# Patient Record
Sex: Male | Born: 1970 | Race: Black or African American | Hispanic: No | Marital: Single
Health system: Southern US, Community
[De-identification: ages and names within clinical notes are randomized; demographics above are authoritative.]

## PROBLEM LIST (undated history)

## (undated) DIAGNOSIS — G8250 Quadriplegia, unspecified: Secondary | ICD-10-CM

## (undated) HISTORY — PX: COLOSTOMY: SHX63

---

## 2009-06-20 ENCOUNTER — Inpatient Hospital Stay: Payer: Self-pay | Admitting: Internal Medicine

## 2009-08-03 ENCOUNTER — Inpatient Hospital Stay: Payer: Self-pay | Admitting: Urology

## 2009-08-13 ENCOUNTER — Ambulatory Visit: Payer: Self-pay | Admitting: Family Medicine

## 2009-08-25 ENCOUNTER — Ambulatory Visit: Payer: Self-pay | Admitting: Urology

## 2009-09-04 ENCOUNTER — Ambulatory Visit: Payer: Self-pay | Admitting: Family Medicine

## 2009-09-08 ENCOUNTER — Other Ambulatory Visit: Payer: Self-pay | Admitting: Urology

## 2009-09-24 ENCOUNTER — Ambulatory Visit: Payer: Self-pay | Admitting: Family Medicine

## 2009-12-30 ENCOUNTER — Ambulatory Visit: Payer: Self-pay | Admitting: General Practice

## 2010-02-01 ENCOUNTER — Ambulatory Visit: Payer: Self-pay | Admitting: Urology

## 2010-03-24 ENCOUNTER — Ambulatory Visit: Payer: Self-pay | Admitting: Family Medicine

## 2010-08-01 ENCOUNTER — Ambulatory Visit: Payer: Self-pay | Admitting: Family Medicine

## 2010-09-21 ENCOUNTER — Ambulatory Visit: Payer: Self-pay | Admitting: Urology

## 2011-01-10 ENCOUNTER — Inpatient Hospital Stay: Payer: Self-pay | Admitting: Specialist

## 2011-06-23 ENCOUNTER — Other Ambulatory Visit: Payer: Self-pay | Admitting: Family Medicine

## 2011-06-23 LAB — CBC WITH DIFFERENTIAL/PLATELET
Basophil #: 0 10*3/uL (ref 0.0–0.1)
Eosinophil %: 0.2 %
HCT: 48.5 % (ref 40.0–52.0)
HGB: 15.9 g/dL (ref 13.0–18.0)
Lymphocyte %: 8.7 %
MCH: 32 pg (ref 26.0–34.0)
MCHC: 32.7 g/dL (ref 32.0–36.0)
MCV: 98 fL (ref 80–100)
Monocyte %: 9.2 %
Neutrophil #: 10.2 10*3/uL — ABNORMAL HIGH (ref 1.4–6.5)
RBC: 4.96 10*6/uL (ref 4.40–5.90)
RDW: 17 % — ABNORMAL HIGH (ref 11.5–14.5)
WBC: 12.6 10*3/uL — ABNORMAL HIGH (ref 3.8–10.6)

## 2011-06-23 LAB — BASIC METABOLIC PANEL
Anion Gap: 15 (ref 7–16)
BUN: 36 mg/dL — ABNORMAL HIGH (ref 7–18)
Calcium, Total: 8.9 mg/dL (ref 8.5–10.1)
Co2: 19 mmol/L — ABNORMAL LOW (ref 21–32)
Creatinine: 2.23 mg/dL — ABNORMAL HIGH (ref 0.60–1.30)
EGFR (African American): 42 — ABNORMAL LOW
EGFR (Non-African Amer.): 35 — ABNORMAL LOW
Glucose: 126 mg/dL — ABNORMAL HIGH (ref 65–99)
Osmolality: 293 (ref 275–301)
Potassium: 4.1 mmol/L (ref 3.5–5.1)
Sodium: 142 mmol/L (ref 136–145)

## 2011-11-04 ENCOUNTER — Other Ambulatory Visit: Payer: Self-pay | Admitting: Family Medicine

## 2011-11-04 LAB — URINALYSIS, COMPLETE
Nitrite: NEGATIVE
RBC,UR: 16 /HPF (ref 0–5)
Specific Gravity: 1.01 (ref 1.003–1.030)
Squamous Epithelial: NONE SEEN
WBC UR: 936 /HPF (ref 0–5)

## 2011-11-06 ENCOUNTER — Inpatient Hospital Stay: Payer: Self-pay | Admitting: Specialist

## 2011-11-06 LAB — CBC
HCT: 54.2 % — ABNORMAL HIGH (ref 40.0–52.0)
MCHC: 33.1 g/dL (ref 32.0–36.0)
Platelet: 247 10*3/uL (ref 150–440)
RBC: 5.37 10*6/uL (ref 4.40–5.90)
RDW: 15.9 % — ABNORMAL HIGH (ref 11.5–14.5)
WBC: 10.2 10*3/uL (ref 3.8–10.6)

## 2011-11-06 LAB — URINALYSIS, COMPLETE
Bilirubin,UR: NEGATIVE
Blood: NEGATIVE
Blood: NEGATIVE
Glucose,UR: NEGATIVE mg/dL (ref 0–75)
Ketone: NEGATIVE
Nitrite: NEGATIVE
Nitrite: NEGATIVE
Ph: 6 (ref 4.5–8.0)
Protein: 100
Protein: 100
RBC,UR: 7 /HPF (ref 0–5)
Specific Gravity: 1.019 (ref 1.003–1.030)
Squamous Epithelial: <1
WBC UR: 166 /HPF (ref 0–5)
WBC UR: 455 /HPF (ref 0–5)

## 2011-11-06 LAB — COMPREHENSIVE METABOLIC PANEL
Albumin: 3.5 g/dL (ref 3.4–5.0)
BUN: 26 mg/dL — ABNORMAL HIGH (ref 7–18)
Bilirubin,Total: 1 mg/dL (ref 0.2–1.0)
Chloride: 110 mmol/L — ABNORMAL HIGH (ref 98–107)
Co2: 18 mmol/L — ABNORMAL LOW (ref 21–32)
Creatinine: 1.21 mg/dL (ref 0.60–1.30)
EGFR (African American): >60
EGFR (Non-African Amer.): >60
Osmolality: 280 (ref 275–301)
SGOT(AST): 18 U/L (ref 15–37)
SGPT (ALT): 15 U/L (ref 12–78)
Sodium: 138 mmol/L (ref 136–145)
Total Protein: 10.8 g/dL — ABNORMAL HIGH (ref 6.4–8.2)

## 2011-11-06 LAB — PROTIME-INR
INR: 1.1
Prothrombin Time: 14.6 secs (ref 11.5–14.7)

## 2011-11-07 LAB — BASIC METABOLIC PANEL
Anion Gap: 7 (ref 7–16)
BUN: 25 mg/dL — ABNORMAL HIGH (ref 7–18)
Calcium, Total: 8.6 mg/dL (ref 8.5–10.1)
EGFR (African American): >60
Glucose: 80 mg/dL (ref 65–99)
Potassium: 3.4 mmol/L — ABNORMAL LOW (ref 3.5–5.1)
Sodium: 141 mmol/L (ref 136–145)

## 2011-11-07 LAB — CBC WITH DIFFERENTIAL/PLATELET
Basophil #: 0.1 10*3/uL (ref 0.0–0.1)
Eosinophil #: 0.2 10*3/uL (ref 0.0–0.7)
Eosinophil %: 2.1 %
HGB: 14.8 g/dL (ref 13.0–18.0)
Lymphocyte #: 4 10*3/uL — ABNORMAL HIGH (ref 1.0–3.6)
Lymphocyte %: 36 %
MCV: 100 fL (ref 80–100)
Monocyte %: 11.6 %
Neutrophil #: 5.5 10*3/uL (ref 1.4–6.5)
Neutrophil %: 49.8 %
Platelet: 219 10*3/uL (ref 150–440)
RBC: 4.44 10*6/uL (ref 4.40–5.90)
RDW: 16.1 % — ABNORMAL HIGH (ref 11.5–14.5)
WBC: 11.1 10*3/uL — ABNORMAL HIGH (ref 3.8–10.6)

## 2011-11-07 LAB — MAGNESIUM: Magnesium: 1.7 mg/dL — ABNORMAL LOW

## 2011-11-08 LAB — CBC WITH DIFFERENTIAL/PLATELET
Basophil #: 0.1 10*3/uL (ref 0.0–0.1)
Basophil %: 1 %
Eosinophil #: 0.2 10*3/uL (ref 0.0–0.7)
Eosinophil %: 3 %
HCT: 40.6 % (ref 40.0–52.0)
HGB: 13.4 g/dL (ref 13.0–18.0)
Lymphocyte #: 2.5 10*3/uL (ref 1.0–3.6)
MCH: 33.1 pg (ref 26.0–34.0)
MCHC: 33 g/dL (ref 32.0–36.0)
MCV: 100 fL (ref 80–100)
Monocyte %: 11 %
Neutrophil #: 3.3 10*3/uL (ref 1.4–6.5)
Neutrophil %: 48.7 %
Platelet: 200 10*3/uL (ref 150–440)
RBC: 4.05 10*6/uL — ABNORMAL LOW (ref 4.40–5.90)

## 2011-11-08 LAB — BASIC METABOLIC PANEL
Anion Gap: 8 (ref 7–16)
BUN: 17 mg/dL (ref 7–18)
Chloride: 114 mmol/L — ABNORMAL HIGH (ref 98–107)
Creatinine: 1.14 mg/dL (ref 0.60–1.30)
EGFR (African American): >60
EGFR (Non-African Amer.): >60
Glucose: 93 mg/dL (ref 65–99)
Osmolality: 286 (ref 275–301)

## 2011-11-08 LAB — POTASSIUM: Potassium: 3.5 mmol/L (ref 3.5–5.1)

## 2011-11-09 LAB — BASIC METABOLIC PANEL
Anion Gap: 7 (ref 7–16)
Calcium, Total: 8.3 mg/dL — ABNORMAL LOW (ref 8.5–10.1)
Co2: 20 mmol/L — ABNORMAL LOW (ref 21–32)
Creatinine: 1.08 mg/dL (ref 0.60–1.30)
EGFR (African American): >60
EGFR (Non-African Amer.): >60
Glucose: 118 mg/dL — ABNORMAL HIGH (ref 65–99)
Sodium: 144 mmol/L (ref 136–145)

## 2011-11-10 LAB — CBC WITH DIFFERENTIAL/PLATELET
Eosinophil #: 0.2 10*3/uL (ref 0.0–0.7)
Eosinophil %: 2.2 %
HCT: 42.8 % (ref 40.0–52.0)
HGB: 14.2 g/dL (ref 13.0–18.0)
Lymphocyte #: 2 10*3/uL (ref 1.0–3.6)
Lymphocyte %: 22.1 %
MCHC: 33.3 g/dL (ref 32.0–36.0)
MCV: 101 fL — ABNORMAL HIGH (ref 80–100)
Monocyte %: 9.7 %
Neutrophil #: 5.9 10*3/uL (ref 1.4–6.5)
Platelet: 181 10*3/uL (ref 150–440)
RDW: 15.7 % — ABNORMAL HIGH (ref 11.5–14.5)

## 2011-11-12 LAB — PATHOLOGY REPORT

## 2011-11-12 LAB — CULTURE, BLOOD (SINGLE)

## 2012-05-08 ENCOUNTER — Other Ambulatory Visit: Payer: Self-pay | Admitting: Family Medicine

## 2012-05-08 LAB — CBC WITH DIFFERENTIAL/PLATELET
Basophil #: 0 10*3/uL (ref 0.0–0.1)
Basophil %: 0.2 %
Eosinophil %: 0.8 %
HCT: 48.8 % (ref 40.0–52.0)
Lymphocyte #: 1.5 10*3/uL (ref 1.0–3.6)
Monocyte #: 0.9 x10 3/mm (ref 0.2–1.0)
Monocyte %: 4.9 %
Neutrophil #: 15.9 10*3/uL — ABNORMAL HIGH (ref 1.4–6.5)
Neutrophil %: 86 %
Platelet: 162 10*3/uL (ref 150–440)
WBC: 18.4 10*3/uL — ABNORMAL HIGH (ref 3.8–10.6)

## 2012-05-08 LAB — COMPREHENSIVE METABOLIC PANEL
Alkaline Phosphatase: 193 U/L — ABNORMAL HIGH (ref 50–136)
Anion Gap: 11 (ref 7–16)
BUN: 28 mg/dL — ABNORMAL HIGH (ref 7–18)
Bilirubin,Total: 3 mg/dL — ABNORMAL HIGH (ref 0.2–1.0)
Calcium, Total: 8.9 mg/dL (ref 8.5–10.1)
Chloride: 108 mmol/L — ABNORMAL HIGH (ref 98–107)
EGFR (African American): >60
Glucose: 123 mg/dL — ABNORMAL HIGH (ref 65–99)
Osmolality: 281 (ref 275–301)
SGOT(AST): 34 U/L (ref 15–37)
SGPT (ALT): 28 U/L (ref 12–78)
Sodium: 137 mmol/L (ref 136–145)
Total Protein: 9.2 g/dL — ABNORMAL HIGH (ref 6.4–8.2)

## 2012-05-08 LAB — URINALYSIS, COMPLETE
Glucose,UR: NEGATIVE mg/dL (ref 0–75)
Ketone: NEGATIVE
Ph: 7 (ref 4.5–8.0)
Squamous Epithelial: 17
WBC UR: 634 /HPF (ref 0–5)

## 2012-05-11 LAB — URINE CULTURE

## 2012-09-04 ENCOUNTER — Other Ambulatory Visit: Payer: Self-pay | Admitting: Family Medicine

## 2012-09-04 LAB — URINALYSIS, COMPLETE
Glucose,UR: NEGATIVE mg/dL (ref 0–75)
Ph: 6 (ref 4.5–8.0)
Protein: >=500
Specific Gravity: 1.02 (ref 1.003–1.030)
Squamous Epithelial: 5

## 2012-09-04 LAB — CBC WITH DIFFERENTIAL/PLATELET
Basophil #: 0.1 10*3/uL (ref 0.0–0.1)
Basophil %: 0.9 %
Eosinophil #: 0.2 10*3/uL (ref 0.0–0.7)
Eosinophil %: 1.5 %
HCT: 47.7 % (ref 40.0–52.0)
Lymphocyte %: 28.7 %
MCV: 100 fL (ref 80–100)
Monocyte %: 10.8 %
Neutrophil %: 58.1 %
Platelet: 359 10*3/uL (ref 150–440)
RBC: 4.76 10*6/uL (ref 4.40–5.90)
WBC: 11.1 10*3/uL — ABNORMAL HIGH (ref 3.8–10.6)

## 2012-09-04 LAB — COMPREHENSIVE METABOLIC PANEL
Albumin: 3.4 g/dL (ref 3.4–5.0)
Alkaline Phosphatase: 198 U/L — ABNORMAL HIGH (ref 50–136)
Bilirubin,Total: 0.6 mg/dL (ref 0.2–1.0)
Calcium, Total: 9.8 mg/dL (ref 8.5–10.1)
Co2: 23 mmol/L (ref 21–32)
Creatinine: 1.29 mg/dL (ref 0.60–1.30)
EGFR (African American): >60
EGFR (Non-African Amer.): >60
Glucose: 106 mg/dL — ABNORMAL HIGH (ref 65–99)
Osmolality: 271 (ref 275–301)
Sodium: 136 mmol/L (ref 136–145)

## 2012-09-12 ENCOUNTER — Other Ambulatory Visit: Payer: Self-pay

## 2012-09-12 LAB — BASIC METABOLIC PANEL
Calcium, Total: 8.4 mg/dL — ABNORMAL LOW (ref 8.5–10.1)
Co2: 23 mmol/L (ref 21–32)
EGFR (Non-African Amer.): >60
Glucose: 100 mg/dL — ABNORMAL HIGH (ref 65–99)
Osmolality: 277 (ref 275–301)
Sodium: 140 mmol/L (ref 136–145)

## 2012-09-12 LAB — CBC WITH DIFFERENTIAL/PLATELET
Basophil %: 1 %
HGB: 14.6 g/dL (ref 13.0–18.0)
Lymphocyte #: 3.9 10*3/uL — ABNORMAL HIGH (ref 1.0–3.6)
Lymphocyte %: 56.4 %
MCHC: 33.5 g/dL (ref 32.0–36.0)
Neutrophil %: 27.3 %
Platelet: 221 10*3/uL (ref 150–440)

## 2012-11-24 ENCOUNTER — Ambulatory Visit: Payer: Self-pay | Admitting: Internal Medicine

## 2012-12-05 ENCOUNTER — Inpatient Hospital Stay: Payer: Self-pay | Admitting: Internal Medicine

## 2012-12-05 LAB — URINALYSIS, COMPLETE
Bilirubin,UR: NEGATIVE
Glucose,UR: NEGATIVE mg/dL (ref 0–75)
Ph: 5 (ref 4.5–8.0)
Protein: >=500
Squamous Epithelial: 39
WBC UR: 3958 /HPF (ref 0–5)

## 2012-12-05 LAB — COMPREHENSIVE METABOLIC PANEL
Albumin: 3.4 g/dL (ref 3.4–5.0)
Alkaline Phosphatase: 201 U/L — ABNORMAL HIGH (ref 50–136)
Anion Gap: 10 (ref 7–16)
BUN: 15 mg/dL (ref 7–18)
Bilirubin,Total: 0.8 mg/dL (ref 0.2–1.0)
Creatinine: 1.57 mg/dL — ABNORMAL HIGH (ref 0.60–1.30)
EGFR (African American): >60
Osmolality: 284 (ref 275–301)
Total Protein: 10.3 g/dL — ABNORMAL HIGH (ref 6.4–8.2)

## 2012-12-05 LAB — CBC
MCH: 34.1 pg — ABNORMAL HIGH (ref 26.0–34.0)
MCHC: 33.2 g/dL (ref 32.0–36.0)
Platelet: 211 10*3/uL (ref 150–440)
RBC: 5.95 10*6/uL — ABNORMAL HIGH (ref 4.40–5.90)
RDW: 17.4 % — ABNORMAL HIGH (ref 11.5–14.5)
WBC: 22.7 10*3/uL — ABNORMAL HIGH (ref 3.8–10.6)

## 2012-12-05 LAB — HEMOGLOBIN A1C: Hemoglobin A1C: 6.2 % (ref 4.2–6.3)

## 2012-12-06 LAB — BASIC METABOLIC PANEL
Anion Gap: 6 — ABNORMAL LOW (ref 7–16)
Anion Gap: 8 (ref 7–16)
BUN: 54 mg/dL — ABNORMAL HIGH (ref 7–18)
Calcium, Total: 7.4 mg/dL — ABNORMAL LOW (ref 8.5–10.1)
Calcium, Total: 6.9 mg/dL — CL (ref 8.5–10.1)
Calcium, Total: 7.6 mg/dL — ABNORMAL LOW (ref 8.5–10.1)
Chloride: 112 mmol/L — ABNORMAL HIGH (ref 98–107)
Chloride: 115 mmol/L — ABNORMAL HIGH (ref 98–107)
Chloride: 115 mmol/L — ABNORMAL HIGH (ref 98–107)
Co2: 16 mmol/L — ABNORMAL LOW (ref 21–32)
Co2: 19 mmol/L — ABNORMAL LOW (ref 21–32)
Co2: 17 mmol/L — ABNORMAL LOW (ref 21–32)
Creatinine: 3.37 mg/dL — ABNORMAL HIGH (ref 0.60–1.30)
EGFR (African American): 25 — ABNORMAL LOW
EGFR (African American): 22 — ABNORMAL LOW
EGFR (African American): 21 — ABNORMAL LOW
EGFR (Non-African Amer.): 21 — ABNORMAL LOW
EGFR (Non-African Amer.): 19 — ABNORMAL LOW
EGFR (Non-African Amer.): 18 — ABNORMAL LOW
Glucose: 244 mg/dL — ABNORMAL HIGH (ref 65–99)
Glucose: 367 mg/dL — ABNORMAL HIGH (ref 65–99)
Glucose: 176 mg/dL — ABNORMAL HIGH (ref 65–99)
Osmolality: 309 (ref 275–301)
Osmolality: 301 (ref 275–301)
Potassium: 7.9 mmol/L (ref 3.5–5.1)
Sodium: 140 mmol/L (ref 136–145)
Sodium: 140 mmol/L (ref 136–145)

## 2012-12-06 LAB — CBC WITH DIFFERENTIAL/PLATELET
HCT: 42.3 % (ref 40.0–52.0)
HGB: 14 g/dL (ref 13.0–18.0)
MCHC: 33 g/dL (ref 32.0–36.0)
Monocytes: 13 %
NRBC/100 WBC: 1 /
Platelet: 87 10*3/uL — ABNORMAL LOW (ref 150–440)
RDW: 17.4 % — ABNORMAL HIGH (ref 11.5–14.5)

## 2012-12-06 LAB — LIPASE, BLOOD: Lipase: 8526 U/L — ABNORMAL HIGH (ref 73–393)

## 2012-12-07 LAB — CBC WITH DIFFERENTIAL/PLATELET
Basophil #: 0.1 10*3/uL (ref 0.0–0.1)
Eosinophil %: 0.3 %
HCT: 40.6 % (ref 40.0–52.0)
HGB: 13.5 g/dL (ref 13.0–18.0)
Lymphocyte #: 3.1 10*3/uL (ref 1.0–3.6)
Lymphocyte %: 16.9 %
Monocyte #: 1.9 x10 3/mm — ABNORMAL HIGH (ref 0.2–1.0)
Neutrophil #: 13.4 10*3/uL — ABNORMAL HIGH (ref 1.4–6.5)
Neutrophil %: 72.3 %
Platelet: 76 10*3/uL — ABNORMAL LOW (ref 150–440)
RDW: 17 % — ABNORMAL HIGH (ref 11.5–14.5)
WBC: 18.5 10*3/uL — ABNORMAL HIGH (ref 3.8–10.6)

## 2012-12-07 LAB — BASIC METABOLIC PANEL
Anion Gap: 9 (ref 7–16)
BUN: 59 mg/dL — ABNORMAL HIGH (ref 7–18)
Chloride: 116 mmol/L — ABNORMAL HIGH (ref 98–107)
Creatinine: 4.23 mg/dL — ABNORMAL HIGH (ref 0.60–1.30)
EGFR (African American): 19 — ABNORMAL LOW
EGFR (Non-African Amer.): 16 — ABNORMAL LOW
Osmolality: 302 (ref 275–301)

## 2012-12-07 LAB — LIPASE, BLOOD: Lipase: 3049 U/L — ABNORMAL HIGH (ref 73–393)

## 2012-12-07 LAB — URINE CULTURE

## 2012-12-07 LAB — MAGNESIUM: Magnesium: 1.4 mg/dL — ABNORMAL LOW

## 2012-12-08 LAB — CBC WITH DIFFERENTIAL/PLATELET
Basophil #: 0 10*3/uL (ref 0.0–0.1)
Basophil %: 0.2 %
Eosinophil %: 1 %
HGB: 12.2 g/dL — ABNORMAL LOW (ref 13.0–18.0)
Lymphocyte #: 2.9 10*3/uL (ref 1.0–3.6)
Lymphocyte %: 15.1 %
MCHC: 34.1 g/dL (ref 32.0–36.0)
MCV: 102 fL — ABNORMAL HIGH (ref 80–100)
Neutrophil #: 15 10*3/uL — ABNORMAL HIGH (ref 1.4–6.5)
Neutrophil %: 77.7 %
Platelet: 62 10*3/uL — ABNORMAL LOW (ref 150–440)

## 2012-12-08 LAB — LIPASE, BLOOD: Lipase: 926 U/L — ABNORMAL HIGH (ref 73–393)

## 2012-12-08 LAB — BASIC METABOLIC PANEL
BUN: 59 mg/dL — ABNORMAL HIGH (ref 7–18)
Calcium, Total: 7.1 mg/dL — ABNORMAL LOW (ref 8.5–10.1)
Chloride: 112 mmol/L — ABNORMAL HIGH (ref 98–107)
Creatinine: 4.66 mg/dL — ABNORMAL HIGH (ref 0.60–1.30)
EGFR (African American): 17 — ABNORMAL LOW
Glucose: 225 mg/dL — ABNORMAL HIGH (ref 65–99)
Potassium: 4.1 mmol/L (ref 3.5–5.1)

## 2012-12-08 LAB — PHOSPHORUS: Phosphorus: 3.1 mg/dL (ref 2.5–4.9)

## 2012-12-09 LAB — BASIC METABOLIC PANEL
Anion Gap: 9 (ref 7–16)
BUN: 54 mg/dL — ABNORMAL HIGH (ref 7–18)
Calcium, Total: 6.7 mg/dL — CL (ref 8.5–10.1)
Co2: 27 mmol/L (ref 21–32)
EGFR (African American): 19 — ABNORMAL LOW
Glucose: 180 mg/dL — ABNORMAL HIGH (ref 65–99)
Potassium: 3 mmol/L — ABNORMAL LOW (ref 3.5–5.1)

## 2012-12-09 LAB — CBC WITH DIFFERENTIAL/PLATELET
Basophil %: 0.4 %
Eosinophil #: 0.3 10*3/uL (ref 0.0–0.7)
Eosinophil %: 1.4 %
HCT: 33.3 % — ABNORMAL LOW (ref 40.0–52.0)
Lymphocyte %: 14 %
Monocyte #: 0.9 x10 3/mm (ref 0.2–1.0)
Monocyte %: 4.5 %
Neutrophil #: 15.4 10*3/uL — ABNORMAL HIGH (ref 1.4–6.5)
Neutrophil %: 79.7 %

## 2012-12-09 LAB — TRIGLYCERIDES: Triglycerides: 305 mg/dL — ABNORMAL HIGH (ref 0–200)

## 2012-12-09 LAB — MAGNESIUM: Magnesium: 2.3 mg/dL

## 2012-12-09 LAB — PHOSPHORUS: Phosphorus: 2.1 mg/dL — ABNORMAL LOW (ref 2.5–4.9)

## 2012-12-10 LAB — CBC WITH DIFFERENTIAL/PLATELET
Eosinophil #: 0.4 10*3/uL (ref 0.0–0.7)
Eosinophil %: 1.9 %
Lymphocyte #: 3.2 10*3/uL (ref 1.0–3.6)
Lymphocyte %: 17.1 %
MCH: 34.5 pg — ABNORMAL HIGH (ref 26.0–34.0)
MCV: 101 fL — ABNORMAL HIGH (ref 80–100)
Monocyte #: 1.4 x10 3/mm — ABNORMAL HIGH (ref 0.2–1.0)
Monocyte %: 7.1 %
Neutrophil %: 73.6 %
RDW: 16.7 % — ABNORMAL HIGH (ref 11.5–14.5)
WBC: 19 10*3/uL — ABNORMAL HIGH (ref 3.8–10.6)

## 2012-12-10 LAB — BASIC METABOLIC PANEL
BUN: 47 mg/dL — ABNORMAL HIGH (ref 7–18)
Calcium, Total: 7.1 mg/dL — ABNORMAL LOW (ref 8.5–10.1)
Chloride: 108 mmol/L — ABNORMAL HIGH (ref 98–107)
Creatinine: 4.22 mg/dL — ABNORMAL HIGH (ref 0.60–1.30)
EGFR (Non-African Amer.): 16 — ABNORMAL LOW
Glucose: 172 mg/dL — ABNORMAL HIGH (ref 65–99)
Osmolality: 301 (ref 275–301)
Sodium: 143 mmol/L (ref 136–145)

## 2012-12-10 LAB — MAGNESIUM: Magnesium: 2 mg/dL

## 2012-12-10 LAB — PHOSPHORUS: Phosphorus: 3 mg/dL (ref 2.5–4.9)

## 2012-12-10 LAB — POTASSIUM: Potassium: 4.2 mmol/L (ref 3.5–5.1)

## 2012-12-10 LAB — VANCOMYCIN, TROUGH: Vancomycin, Trough: 25 ug/mL (ref 10–20)

## 2012-12-11 LAB — CBC WITH DIFFERENTIAL/PLATELET
Eosinophil #: 0.3 10*3/uL (ref 0.0–0.7)
HCT: 33.3 % — ABNORMAL LOW (ref 40.0–52.0)
HGB: 11.3 g/dL — ABNORMAL LOW (ref 13.0–18.0)
Lymphocyte #: 3.4 10*3/uL (ref 1.0–3.6)
Lymphocyte %: 15.4 %
MCHC: 34 g/dL (ref 32.0–36.0)
Monocyte %: 8.9 %
Neutrophil #: 16.6 10*3/uL — ABNORMAL HIGH (ref 1.4–6.5)
Neutrophil %: 74.3 %
Platelet: 133 10*3/uL — ABNORMAL LOW (ref 150–440)
RBC: 3.29 10*6/uL — ABNORMAL LOW (ref 4.40–5.90)
RDW: 16.4 % — ABNORMAL HIGH (ref 11.5–14.5)
WBC: 22.3 10*3/uL — ABNORMAL HIGH (ref 3.8–10.6)

## 2012-12-11 LAB — BASIC METABOLIC PANEL
BUN: 47 mg/dL — ABNORMAL HIGH (ref 7–18)
Chloride: 109 mmol/L — ABNORMAL HIGH (ref 98–107)
Co2: 24 mmol/L (ref 21–32)
Creatinine: 4.28 mg/dL — ABNORMAL HIGH (ref 0.60–1.30)
EGFR (African American): 19 — ABNORMAL LOW
Potassium: 3.5 mmol/L (ref 3.5–5.1)

## 2012-12-11 LAB — MAGNESIUM: Magnesium: 2.1 mg/dL

## 2012-12-12 LAB — BASIC METABOLIC PANEL
BUN: 42 mg/dL — ABNORMAL HIGH (ref 7–18)
Calcium, Total: 7.6 mg/dL — ABNORMAL LOW (ref 8.5–10.1)
Creatinine: 3.94 mg/dL — ABNORMAL HIGH (ref 0.60–1.30)
EGFR (Non-African Amer.): 18 — ABNORMAL LOW
Potassium: 3 mmol/L — ABNORMAL LOW (ref 3.5–5.1)
Sodium: 142 mmol/L (ref 136–145)

## 2012-12-12 LAB — CBC WITH DIFFERENTIAL/PLATELET
Basophil #: 0.1 10*3/uL (ref 0.0–0.1)
Basophil %: 0.7 %
Eosinophil #: 0.4 10*3/uL (ref 0.0–0.7)
HCT: 32.2 % — ABNORMAL LOW (ref 40.0–52.0)
Lymphocyte #: 3.6 10*3/uL (ref 1.0–3.6)
MCH: 34.7 pg — ABNORMAL HIGH (ref 26.0–34.0)
MCHC: 34.5 g/dL (ref 32.0–36.0)
MCV: 101 fL — ABNORMAL HIGH (ref 80–100)
Monocyte #: 1.9 x10 3/mm — ABNORMAL HIGH (ref 0.2–1.0)
Monocyte %: 8.8 %
Neutrophil %: 71.6 %
RDW: 16 % — ABNORMAL HIGH (ref 11.5–14.5)

## 2012-12-12 LAB — PHOSPHORUS: Phosphorus: 4.5 mg/dL (ref 2.5–4.9)

## 2012-12-12 LAB — MAGNESIUM: Magnesium: 1.8 mg/dL

## 2012-12-12 LAB — POTASSIUM: Potassium: 3.2 mmol/L — ABNORMAL LOW (ref 3.5–5.1)

## 2012-12-12 LAB — CULTURE, BLOOD (SINGLE)

## 2012-12-12 LAB — VANCOMYCIN, TROUGH: Vancomycin, Trough: 30 ug/mL (ref 10–20)

## 2012-12-13 LAB — CBC WITH DIFFERENTIAL/PLATELET
Basophil #: 0.1 10*3/uL (ref 0.0–0.1)
Eosinophil #: 0.5 10*3/uL (ref 0.0–0.7)
Eosinophil %: 1.9 %
HCT: 34.2 % — ABNORMAL LOW (ref 40.0–52.0)
HGB: 11.5 g/dL — ABNORMAL LOW (ref 13.0–18.0)
MCH: 33.9 pg (ref 26.0–34.0)
MCV: 101 fL — ABNORMAL HIGH (ref 80–100)
Neutrophil #: 21.6 10*3/uL — ABNORMAL HIGH (ref 1.4–6.5)
RBC: 3.39 10*6/uL — ABNORMAL LOW (ref 4.40–5.90)
WBC: 27.2 10*3/uL — ABNORMAL HIGH (ref 3.8–10.6)

## 2012-12-13 LAB — BASIC METABOLIC PANEL
Anion Gap: 10 (ref 7–16)
Calcium, Total: 7.9 mg/dL — ABNORMAL LOW (ref 8.5–10.1)
Co2: 22 mmol/L (ref 21–32)
Creatinine: 3.57 mg/dL — ABNORMAL HIGH (ref 0.60–1.30)
EGFR (Non-African Amer.): 20 — ABNORMAL LOW
Osmolality: 297 (ref 275–301)

## 2012-12-13 LAB — PHOSPHORUS: Phosphorus: 3.5 mg/dL (ref 2.5–4.9)

## 2012-12-14 LAB — BRONCHIAL WASH CULTURE

## 2012-12-14 LAB — MAGNESIUM: Magnesium: 1.9 mg/dL

## 2012-12-14 LAB — SODIUM: Sodium: 143 mmol/L (ref 136–145)

## 2012-12-14 LAB — POTASSIUM: Potassium: 3.3 mmol/L — ABNORMAL LOW (ref 3.5–5.1)

## 2012-12-14 LAB — ALBUMIN: Albumin: 1.5 g/dL — ABNORMAL LOW (ref 3.4–5.0)

## 2012-12-14 LAB — CULTURE, BLOOD (SINGLE)

## 2012-12-15 LAB — BASIC METABOLIC PANEL
BUN: 30 mg/dL — ABNORMAL HIGH (ref 7–18)
Calcium, Total: 8.2 mg/dL — ABNORMAL LOW (ref 8.5–10.1)
Chloride: 111 mmol/L — ABNORMAL HIGH (ref 98–107)
Co2: 22 mmol/L (ref 21–32)
EGFR (African American): 32 — ABNORMAL LOW
EGFR (Non-African Amer.): 27 — ABNORMAL LOW
Glucose: 211 mg/dL — ABNORMAL HIGH (ref 65–99)
Osmolality: 297 (ref 275–301)
Potassium: 3.9 mmol/L (ref 3.5–5.1)
Sodium: 143 mmol/L (ref 136–145)

## 2012-12-15 LAB — CBC WITH DIFFERENTIAL/PLATELET
Eosinophil %: 2.2 %
HCT: 31.8 % — ABNORMAL LOW (ref 40.0–52.0)
HGB: 10.9 g/dL — ABNORMAL LOW (ref 13.0–18.0)
Lymphocyte %: 14.7 %
MCH: 34.8 pg — ABNORMAL HIGH (ref 26.0–34.0)
MCHC: 34.4 g/dL (ref 32.0–36.0)
MCV: 101 fL — ABNORMAL HIGH (ref 80–100)
Monocyte %: 6.7 %
Neutrophil %: 75.9 %
Platelet: 361 10*3/uL (ref 150–440)
RBC: 3.14 10*6/uL — ABNORMAL LOW (ref 4.40–5.90)
RDW: 16.3 % — ABNORMAL HIGH (ref 11.5–14.5)
WBC: 25.6 10*3/uL — ABNORMAL HIGH (ref 3.8–10.6)

## 2012-12-15 LAB — PHOSPHORUS: Phosphorus: 3.3 mg/dL (ref 2.5–4.9)

## 2012-12-15 LAB — MAGNESIUM: Magnesium: 1.7 mg/dL — ABNORMAL LOW

## 2012-12-15 LAB — TRIGLYCERIDES: Triglycerides: 373 mg/dL — ABNORMAL HIGH (ref 0–200)

## 2012-12-16 LAB — BASIC METABOLIC PANEL
BUN: 30 mg/dL — ABNORMAL HIGH (ref 7–18)
Chloride: 111 mmol/L — ABNORMAL HIGH (ref 98–107)
Co2: 24 mmol/L (ref 21–32)
Creatinine: 2.53 mg/dL — ABNORMAL HIGH (ref 0.60–1.30)
EGFR (African American): 35 — ABNORMAL LOW
Glucose: 187 mg/dL — ABNORMAL HIGH (ref 65–99)
Osmolality: 298 (ref 275–301)

## 2012-12-16 LAB — PHOSPHORUS: Phosphorus: 3.9 mg/dL (ref 2.5–4.9)

## 2012-12-16 LAB — MAGNESIUM: Magnesium: 2 mg/dL

## 2012-12-17 LAB — CBC WITH DIFFERENTIAL/PLATELET
Basophil #: 0.2 10*3/uL — ABNORMAL HIGH (ref 0.0–0.1)
Basophil %: 1.2 %
Eosinophil #: 0.4 10*3/uL (ref 0.0–0.7)
Eosinophil %: 2.2 %
HCT: 31.3 % — ABNORMAL LOW (ref 40.0–52.0)
Lymphocyte #: 4.1 10*3/uL — ABNORMAL HIGH (ref 1.0–3.6)
MCHC: 33.6 g/dL (ref 32.0–36.0)
Monocyte #: 1.1 x10 3/mm — ABNORMAL HIGH (ref 0.2–1.0)
Monocyte %: 5.7 %
Neutrophil #: 13.4 10*3/uL — ABNORMAL HIGH (ref 1.4–6.5)
Neutrophil %: 69.7 %
RBC: 3.08 10*6/uL — ABNORMAL LOW (ref 4.40–5.90)

## 2012-12-17 LAB — BASIC METABOLIC PANEL
Anion Gap: 9 (ref 7–16)
Calcium, Total: 8.2 mg/dL — ABNORMAL LOW (ref 8.5–10.1)
Chloride: 113 mmol/L — ABNORMAL HIGH (ref 98–107)
Co2: 23 mmol/L (ref 21–32)
Creatinine: 2.33 mg/dL — ABNORMAL HIGH (ref 0.60–1.30)
Potassium: 3 mmol/L — ABNORMAL LOW (ref 3.5–5.1)

## 2012-12-17 LAB — CULTURE, BLOOD (SINGLE)

## 2012-12-17 LAB — POTASSIUM: Potassium: 3.9 mmol/L (ref 3.5–5.1)

## 2012-12-18 LAB — CBC WITH DIFFERENTIAL/PLATELET
Basophil %: 0.8 %
Eosinophil #: 0.4 10*3/uL (ref 0.0–0.7)
HCT: 29.2 % — ABNORMAL LOW (ref 40.0–52.0)
Lymphocyte %: 21 %
MCV: 102 fL — ABNORMAL HIGH (ref 80–100)
Monocyte #: 1.2 x10 3/mm — ABNORMAL HIGH (ref 0.2–1.0)
Monocyte %: 7.6 %
Neutrophil #: 10.6 10*3/uL — ABNORMAL HIGH (ref 1.4–6.5)
Neutrophil %: 68.1 %
Platelet: 394 10*3/uL (ref 150–440)
WBC: 15.5 10*3/uL — ABNORMAL HIGH (ref 3.8–10.6)

## 2012-12-18 LAB — MAGNESIUM: Magnesium: 1.7 mg/dL — ABNORMAL LOW

## 2012-12-18 LAB — TRIGLYCERIDES: Triglycerides: 426 mg/dL — ABNORMAL HIGH (ref 0–200)

## 2012-12-18 LAB — BASIC METABOLIC PANEL
Anion Gap: 8 (ref 7–16)
BUN: 34 mg/dL — ABNORMAL HIGH (ref 7–18)
Calcium, Total: 8.2 mg/dL — ABNORMAL LOW (ref 8.5–10.1)
Co2: 22 mmol/L (ref 21–32)
Creatinine: 2.2 mg/dL — ABNORMAL HIGH (ref 0.60–1.30)
EGFR (African American): 42 — ABNORMAL LOW
EGFR (Non-African Amer.): 36 — ABNORMAL LOW
Osmolality: 305 (ref 275–301)
Sodium: 147 mmol/L — ABNORMAL HIGH (ref 136–145)

## 2012-12-19 LAB — CBC WITH DIFFERENTIAL/PLATELET
Basophil #: 0.1 10*3/uL (ref 0.0–0.1)
Eosinophil #: 0.4 10*3/uL (ref 0.0–0.7)
Eosinophil %: 3.1 %
HGB: 9.9 g/dL — ABNORMAL LOW (ref 13.0–18.0)
MCHC: 33 g/dL (ref 32.0–36.0)
Neutrophil #: 8.7 10*3/uL — ABNORMAL HIGH (ref 1.4–6.5)
Neutrophil %: 62.6 %
Platelet: 393 10*3/uL (ref 150–440)
RBC: 2.91 10*6/uL — ABNORMAL LOW (ref 4.40–5.90)
RDW: 16.2 % — ABNORMAL HIGH (ref 11.5–14.5)
WBC: 14 10*3/uL — ABNORMAL HIGH (ref 3.8–10.6)

## 2012-12-19 LAB — BASIC METABOLIC PANEL
Anion Gap: 7 (ref 7–16)
BUN: 35 mg/dL — ABNORMAL HIGH (ref 7–18)
Calcium, Total: 8.8 mg/dL (ref 8.5–10.1)
Chloride: 117 mmol/L — ABNORMAL HIGH (ref 98–107)
Co2: 21 mmol/L (ref 21–32)
EGFR (Non-African Amer.): 43 — ABNORMAL LOW
Osmolality: 302 (ref 275–301)
Sodium: 145 mmol/L (ref 136–145)

## 2012-12-19 LAB — MAGNESIUM: Magnesium: 2 mg/dL

## 2012-12-19 LAB — PHOSPHORUS: Phosphorus: 4.5 mg/dL (ref 2.5–4.9)

## 2012-12-20 LAB — BASIC METABOLIC PANEL
BUN: 35 mg/dL — ABNORMAL HIGH (ref 7–18)
Calcium, Total: 8.6 mg/dL (ref 8.5–10.1)
Co2: 20 mmol/L — ABNORMAL LOW (ref 21–32)
Creatinine: 1.69 mg/dL — ABNORMAL HIGH (ref 0.60–1.30)
EGFR (Non-African Amer.): 49 — ABNORMAL LOW
Glucose: 167 mg/dL — ABNORMAL HIGH (ref 65–99)
Osmolality: 300 (ref 275–301)
Potassium: 3.7 mmol/L (ref 3.5–5.1)
Sodium: 145 mmol/L (ref 136–145)

## 2012-12-20 LAB — MAGNESIUM: Magnesium: 1.9 mg/dL

## 2012-12-20 LAB — CULTURE, BLOOD (SINGLE)

## 2012-12-20 LAB — WBC: WBC: 14.3 10*3/uL — ABNORMAL HIGH (ref 3.8–10.6)

## 2012-12-20 LAB — TRIGLYCERIDES: Triglycerides: 343 mg/dL — ABNORMAL HIGH (ref 0–200)

## 2012-12-21 LAB — URINALYSIS, COMPLETE
Ketone: NEGATIVE
Nitrite: NEGATIVE
Protein: 30
Specific Gravity: 1.012 (ref 1.003–1.030)
Squamous Epithelial: 4
WBC UR: 92 /HPF (ref 0–5)

## 2012-12-21 LAB — CLOSTRIDIUM DIFFICILE BY PCR

## 2012-12-21 LAB — CBC WITH DIFFERENTIAL/PLATELET
Bands: 3 %
MCH: 34.6 pg — ABNORMAL HIGH (ref 26.0–34.0)
MCHC: 33.7 g/dL (ref 32.0–36.0)
NRBC/100 WBC: 1 /
Platelet: 424 10*3/uL (ref 150–440)
RBC: 2.86 10*6/uL — ABNORMAL LOW (ref 4.40–5.90)
RDW: 16.6 % — ABNORMAL HIGH (ref 11.5–14.5)
Segmented Neutrophils: 61 %
WBC: 14.7 10*3/uL — ABNORMAL HIGH (ref 3.8–10.6)

## 2012-12-21 LAB — BASIC METABOLIC PANEL
Calcium, Total: 8.5 mg/dL (ref 8.5–10.1)
Chloride: 114 mmol/L — ABNORMAL HIGH (ref 98–107)
Co2: 23 mmol/L (ref 21–32)
EGFR (African American): 56 — ABNORMAL LOW
EGFR (Non-African Amer.): 49 — ABNORMAL LOW
Sodium: 143 mmol/L (ref 136–145)

## 2012-12-21 LAB — MAGNESIUM: Magnesium: 1.6 mg/dL — ABNORMAL LOW

## 2012-12-22 LAB — BASIC METABOLIC PANEL
Anion Gap: 6 — ABNORMAL LOW (ref 7–16)
BUN: 37 mg/dL — ABNORMAL HIGH (ref 7–18)
Calcium, Total: 8.4 mg/dL — ABNORMAL LOW (ref 8.5–10.1)
Chloride: 112 mmol/L — ABNORMAL HIGH (ref 98–107)
Creatinine: 1.65 mg/dL — ABNORMAL HIGH (ref 0.60–1.30)
EGFR (African American): 59 — ABNORMAL LOW
EGFR (Non-African Amer.): 51 — ABNORMAL LOW
Glucose: 149 mg/dL — ABNORMAL HIGH (ref 65–99)
Osmolality: 295 (ref 275–301)
Potassium: 3.7 mmol/L (ref 3.5–5.1)

## 2012-12-22 LAB — CBC WITH DIFFERENTIAL/PLATELET
Basophil #: 0.1 10*3/uL (ref 0.0–0.1)
Basophil %: 1.2 %
Eosinophil #: 0.3 10*3/uL (ref 0.0–0.7)
Eosinophil %: 2.8 %
HGB: 9.6 g/dL — ABNORMAL LOW (ref 13.0–18.0)
MCHC: 33.4 g/dL (ref 32.0–36.0)
Monocyte #: 1.3 x10 3/mm — ABNORMAL HIGH (ref 0.2–1.0)
Neutrophil #: 6.4 10*3/uL (ref 1.4–6.5)
Neutrophil %: 53.7 %
Platelet: 396 10*3/uL (ref 150–440)
RBC: 2.77 10*6/uL — ABNORMAL LOW (ref 4.40–5.90)
WBC: 12 10*3/uL — ABNORMAL HIGH (ref 3.8–10.6)

## 2012-12-22 LAB — URINALYSIS, COMPLETE
Bacteria: NONE SEEN
Bilirubin,UR: NEGATIVE
Protein: 100
WBC UR: 849 /HPF (ref 0–5)

## 2012-12-22 LAB — MAGNESIUM: Magnesium: 2.1 mg/dL

## 2012-12-23 LAB — CBC WITH DIFFERENTIAL/PLATELET
Eosinophil %: 2.8 %
HCT: 29.5 % — ABNORMAL LOW (ref 40.0–52.0)
Lymphocyte #: 2.6 10*3/uL (ref 1.0–3.6)
Lymphocyte %: 20.7 %
MCH: 34.3 pg — ABNORMAL HIGH (ref 26.0–34.0)
MCHC: 33 g/dL (ref 32.0–36.0)
MCV: 104 fL — ABNORMAL HIGH (ref 80–100)
Monocyte %: 8.4 %
Neutrophil #: 8.5 10*3/uL — ABNORMAL HIGH (ref 1.4–6.5)
Neutrophil %: 67.4 %
RBC: 2.84 10*6/uL — ABNORMAL LOW (ref 4.40–5.90)
RDW: 16.7 % — ABNORMAL HIGH (ref 11.5–14.5)
WBC: 12.6 10*3/uL — ABNORMAL HIGH (ref 3.8–10.6)

## 2012-12-23 LAB — BASIC METABOLIC PANEL
BUN: 33 mg/dL — ABNORMAL HIGH (ref 7–18)
Calcium, Total: 8.7 mg/dL (ref 8.5–10.1)
Chloride: 116 mmol/L — ABNORMAL HIGH (ref 98–107)
Co2: 21 mmol/L (ref 21–32)
EGFR (African American): >60
EGFR (Non-African Amer.): 54 — ABNORMAL LOW
Potassium: 3.9 mmol/L (ref 3.5–5.1)

## 2012-12-23 LAB — PHOSPHORUS: Phosphorus: 4.7 mg/dL (ref 2.5–4.9)

## 2012-12-24 ENCOUNTER — Ambulatory Visit: Payer: Self-pay | Admitting: Internal Medicine

## 2012-12-24 LAB — BASIC METABOLIC PANEL
Anion Gap: 9 (ref 7–16)
Calcium, Total: 8.6 mg/dL (ref 8.5–10.1)
Chloride: 113 mmol/L — ABNORMAL HIGH (ref 98–107)
Co2: 21 mmol/L (ref 21–32)
EGFR (African American): >60
Glucose: 224 mg/dL — ABNORMAL HIGH (ref 65–99)
Potassium: 3.4 mmol/L — ABNORMAL LOW (ref 3.5–5.1)
Sodium: 143 mmol/L (ref 136–145)

## 2012-12-24 LAB — CBC WITH DIFFERENTIAL/PLATELET
Basophil #: 0.1 10*3/uL (ref 0.0–0.1)
Basophil %: 0.9 %
HCT: 29.9 % — ABNORMAL LOW (ref 40.0–52.0)
HGB: 10 g/dL — ABNORMAL LOW (ref 13.0–18.0)
Lymphocyte %: 25.3 %
MCH: 34.9 pg — ABNORMAL HIGH (ref 26.0–34.0)
MCHC: 33.5 g/dL (ref 32.0–36.0)
Monocyte #: 1.1 x10 3/mm — ABNORMAL HIGH (ref 0.2–1.0)
Monocyte %: 11.3 %
Neutrophil #: 5.8 10*3/uL (ref 1.4–6.5)
Neutrophil %: 58.4 %
RBC: 2.87 10*6/uL — ABNORMAL LOW (ref 4.40–5.90)

## 2012-12-24 LAB — TRIGLYCERIDES: Triglycerides: 453 mg/dL — ABNORMAL HIGH (ref 0–200)

## 2012-12-24 LAB — POTASSIUM: Potassium: 3.6 mmol/L (ref 3.5–5.1)

## 2012-12-24 LAB — PHOSPHORUS: Phosphorus: 4 mg/dL (ref 2.5–4.9)

## 2012-12-24 LAB — MAGNESIUM: Magnesium: 1.8 mg/dL

## 2012-12-25 LAB — BASIC METABOLIC PANEL
BUN: 33 mg/dL — ABNORMAL HIGH (ref 7–18)
Calcium, Total: 8.9 mg/dL (ref 8.5–10.1)
EGFR (Non-African Amer.): >60
Glucose: 178 mg/dL — ABNORMAL HIGH (ref 65–99)
Potassium: 3.7 mmol/L (ref 3.5–5.1)

## 2012-12-25 LAB — CBC WITH DIFFERENTIAL/PLATELET
Basophil #: 0.1 10*3/uL (ref 0.0–0.1)
Eosinophil #: 0.3 10*3/uL (ref 0.0–0.7)
Eosinophil %: 2.1 %
HCT: 29.8 % — ABNORMAL LOW (ref 40.0–52.0)
Lymphocyte #: 2.3 10*3/uL (ref 1.0–3.6)
MCHC: 33.7 g/dL (ref 32.0–36.0)
Monocyte #: 1.3 x10 3/mm — ABNORMAL HIGH (ref 0.2–1.0)
Platelet: 381 10*3/uL (ref 150–440)
RDW: 17.1 % — ABNORMAL HIGH (ref 11.5–14.5)
WBC: 15 10*3/uL — ABNORMAL HIGH (ref 3.8–10.6)

## 2012-12-25 LAB — TRIGLYCERIDES: Triglycerides: 361 mg/dL — ABNORMAL HIGH (ref 0–200)

## 2012-12-25 LAB — PHOSPHORUS: Phosphorus: 2.6 mg/dL (ref 2.5–4.9)

## 2012-12-25 LAB — MAGNESIUM: Magnesium: 1.7 mg/dL — ABNORMAL LOW

## 2012-12-26 LAB — CBC WITH DIFFERENTIAL/PLATELET
Basophil %: 0.5 %
Eosinophil #: 0 10*3/uL (ref 0.0–0.7)
HCT: 28.2 % — ABNORMAL LOW (ref 40.0–52.0)
HGB: 9.3 g/dL — ABNORMAL LOW (ref 13.0–18.0)
Lymphocyte #: 1.9 10*3/uL (ref 1.0–3.6)
MCH: 34.2 pg — ABNORMAL HIGH (ref 26.0–34.0)
Monocyte #: 1.4 x10 3/mm — ABNORMAL HIGH (ref 0.2–1.0)
Monocyte %: 7.5 %
Neutrophil #: 15.3 10*3/uL — ABNORMAL HIGH (ref 1.4–6.5)
RBC: 2.72 10*6/uL — ABNORMAL LOW (ref 4.40–5.90)
WBC: 18.8 10*3/uL — ABNORMAL HIGH (ref 3.8–10.6)

## 2012-12-26 LAB — BASIC METABOLIC PANEL
Anion Gap: 8 (ref 7–16)
Anion Gap: 12 (ref 7–16)
BUN: 36 mg/dL — ABNORMAL HIGH (ref 7–18)
BUN: 40 mg/dL — ABNORMAL HIGH (ref 7–18)
Calcium, Total: 8.6 mg/dL (ref 8.5–10.1)
Calcium, Total: 8.4 mg/dL — ABNORMAL LOW (ref 8.5–10.1)
Co2: 21 mmol/L (ref 21–32)
Creatinine: 1.43 mg/dL — ABNORMAL HIGH (ref 0.60–1.30)
EGFR (African American): >60
EGFR (Non-African Amer.): >60
EGFR (Non-African Amer.): >60
Glucose: 167 mg/dL — ABNORMAL HIGH (ref 65–99)
Glucose: 166 mg/dL — ABNORMAL HIGH (ref 65–99)
Osmolality: 297 (ref 275–301)
Osmolality: 300 (ref 275–301)
Potassium: 3.5 mmol/L (ref 3.5–5.1)
Potassium: 3.7 mmol/L (ref 3.5–5.1)
Sodium: 143 mmol/L (ref 136–145)
Sodium: 144 mmol/L (ref 136–145)

## 2012-12-26 LAB — CULTURE, BLOOD (SINGLE)

## 2012-12-26 LAB — PROTIME-INR
INR: 1.2
Prothrombin Time: 15.7 secs — ABNORMAL HIGH (ref 11.5–14.7)

## 2012-12-26 LAB — MAGNESIUM: Magnesium: 1.9 mg/dL

## 2012-12-27 LAB — BASIC METABOLIC PANEL
Anion Gap: 13 (ref 7–16)
BUN: 41 mg/dL — ABNORMAL HIGH (ref 7–18)
Chloride: 112 mmol/L — ABNORMAL HIGH (ref 98–107)
Chloride: 109 mmol/L — ABNORMAL HIGH (ref 98–107)
Co2: 18 mmol/L — ABNORMAL LOW (ref 21–32)
Co2: 23 mmol/L (ref 21–32)
Creatinine: 1.69 mg/dL — ABNORMAL HIGH (ref 0.60–1.30)
EGFR (African American): 57 — ABNORMAL LOW
EGFR (Non-African Amer.): 54 — ABNORMAL LOW
Glucose: 268 mg/dL — ABNORMAL HIGH (ref 65–99)
Glucose: 244 mg/dL — ABNORMAL HIGH (ref 65–99)
Osmolality: 305 (ref 275–301)
Potassium: 3.5 mmol/L (ref 3.5–5.1)
Potassium: 2.5 mmol/L — CL (ref 3.5–5.1)
Sodium: 143 mmol/L (ref 136–145)

## 2012-12-27 LAB — POTASSIUM
Potassium: 2.4 mmol/L — CL (ref 3.5–5.1)
Potassium: 2.1 mmol/L — CL (ref 3.5–5.1)

## 2012-12-27 LAB — MAGNESIUM: Magnesium: 1.7 mg/dL — ABNORMAL LOW

## 2012-12-27 LAB — PHOSPHORUS: Phosphorus: 4.5 mg/dL (ref 2.5–4.9)

## 2012-12-28 LAB — BASIC METABOLIC PANEL
Anion Gap: 9 (ref 7–16)
BUN: 40 mg/dL — ABNORMAL HIGH (ref 7–18)
Chloride: 112 mmol/L — ABNORMAL HIGH (ref 98–107)
Co2: 23 mmol/L (ref 21–32)
Creatinine: 1.48 mg/dL — ABNORMAL HIGH (ref 0.60–1.30)
EGFR (African American): >60
EGFR (Non-African Amer.): 58 — ABNORMAL LOW
Glucose: 154 mg/dL — ABNORMAL HIGH (ref 65–99)
Potassium: 3.3 mmol/L — ABNORMAL LOW (ref 3.5–5.1)
Sodium: 144 mmol/L (ref 136–145)

## 2012-12-28 LAB — PHOSPHORUS: Phosphorus: 3.5 mg/dL (ref 2.5–4.9)

## 2012-12-28 LAB — POTASSIUM: Potassium: 4.4 mmol/L (ref 3.5–5.1)

## 2012-12-29 LAB — MAGNESIUM: Magnesium: 2 mg/dL

## 2012-12-30 LAB — CULTURE, BLOOD (SINGLE)

## 2012-12-30 LAB — PHOSPHORUS: Phosphorus: 3.5 mg/dL (ref 2.5–4.9)

## 2012-12-31 LAB — CBC WITH DIFFERENTIAL/PLATELET
Basophil #: 0.1 10*3/uL (ref 0.0–0.1)
Basophil %: 0.6 %
Eosinophil #: 0.8 10*3/uL — ABNORMAL HIGH (ref 0.0–0.7)
HCT: 30.1 % — ABNORMAL LOW (ref 40.0–52.0)
Lymphocyte #: 2.1 10*3/uL (ref 1.0–3.6)
MCV: 103 fL — ABNORMAL HIGH (ref 80–100)
Neutrophil #: 14.6 10*3/uL — ABNORMAL HIGH (ref 1.4–6.5)
Neutrophil %: 77.7 %
Platelet: 203 10*3/uL (ref 150–440)

## 2012-12-31 LAB — BASIC METABOLIC PANEL
Anion Gap: 9 (ref 7–16)
Calcium, Total: 8.5 mg/dL (ref 8.5–10.1)
EGFR (African American): >60
Glucose: 195 mg/dL — ABNORMAL HIGH (ref 65–99)
Osmolality: 297 (ref 275–301)

## 2012-12-31 LAB — URINALYSIS, COMPLETE
Bilirubin,UR: NEGATIVE
Glucose,UR: 50 mg/dL (ref 0–75)
Ketone: NEGATIVE
Ph: 7 (ref 4.5–8.0)
Specific Gravity: 1.011 (ref 1.003–1.030)
WBC UR: 3377 /HPF (ref 0–5)

## 2012-12-31 LAB — MAGNESIUM: Magnesium: 1.7 mg/dL — ABNORMAL LOW

## 2013-01-01 LAB — BASIC METABOLIC PANEL
Anion Gap: 8 (ref 7–16)
Chloride: 109 mmol/L — ABNORMAL HIGH (ref 98–107)
Co2: 23 mmol/L (ref 21–32)
Creatinine: 1.16 mg/dL (ref 0.60–1.30)
EGFR (Non-African Amer.): >60
Osmolality: 299 (ref 275–301)
Potassium: 3.4 mmol/L — ABNORMAL LOW (ref 3.5–5.1)
Sodium: 140 mmol/L (ref 136–145)

## 2013-01-01 LAB — MAGNESIUM: Magnesium: 1.7 mg/dL — ABNORMAL LOW

## 2013-01-01 LAB — PHOSPHORUS: Phosphorus: 4.2 mg/dL (ref 2.5–4.9)

## 2013-01-02 LAB — BASIC METABOLIC PANEL
BUN: 41 mg/dL — ABNORMAL HIGH (ref 7–18)
Chloride: 111 mmol/L — ABNORMAL HIGH (ref 98–107)
Co2: 23 mmol/L (ref 21–32)
Creatinine: 1.03 mg/dL (ref 0.60–1.30)
EGFR (African American): >60
EGFR (Non-African Amer.): >60
Osmolality: 299 (ref 275–301)
Sodium: 142 mmol/L (ref 136–145)

## 2013-01-02 LAB — PHOSPHORUS: Phosphorus: 3.4 mg/dL (ref 2.5–4.9)

## 2013-01-02 LAB — MAGNESIUM: Magnesium: 1.6 mg/dL — ABNORMAL LOW

## 2013-01-03 ENCOUNTER — Ambulatory Visit: Payer: Self-pay | Admitting: Urology

## 2013-01-03 LAB — BASIC METABOLIC PANEL
Anion Gap: 9 (ref 7–16)
Calcium, Total: 9.7 mg/dL (ref 8.5–10.1)
Chloride: 114 mmol/L — ABNORMAL HIGH (ref 98–107)
Co2: 26 mmol/L (ref 21–32)
Creatinine: 0.96 mg/dL (ref 0.60–1.30)
EGFR (African American): >60
Glucose: 97 mg/dL (ref 65–99)
Osmolality: 303 (ref 275–301)
Potassium: 3 mmol/L — ABNORMAL LOW (ref 3.5–5.1)

## 2013-01-03 LAB — TSH: Thyroid Stimulating Horm: 1.01 u[IU]/mL

## 2013-01-03 LAB — POTASSIUM: Potassium: 3.6 mmol/L (ref 3.5–5.1)

## 2013-01-03 LAB — URINE CULTURE

## 2013-01-03 LAB — MAGNESIUM: Magnesium: 1.9 mg/dL

## 2013-01-04 LAB — BASIC METABOLIC PANEL
Anion Gap: 7 (ref 7–16)
BUN: 31 mg/dL — ABNORMAL HIGH (ref 7–18)
Chloride: 118 mmol/L — ABNORMAL HIGH (ref 98–107)
Co2: 28 mmol/L (ref 21–32)
Creatinine: 1.33 mg/dL — ABNORMAL HIGH (ref 0.60–1.30)
EGFR (Non-African Amer.): >60
Glucose: 102 mg/dL — ABNORMAL HIGH (ref 65–99)
Osmolality: 310 (ref 275–301)
Potassium: 3.6 mmol/L (ref 3.5–5.1)
Sodium: 153 mmol/L — ABNORMAL HIGH (ref 136–145)

## 2013-01-04 LAB — CBC WITH DIFFERENTIAL/PLATELET
Basophil %: 0.6 %
Eosinophil %: 1.3 %
HCT: 34 % — ABNORMAL LOW (ref 40.0–52.0)
Lymphocyte #: 2.2 10*3/uL (ref 1.0–3.6)
Lymphocyte %: 10.7 %
MCH: 33 pg (ref 26.0–34.0)
MCV: 101 fL — ABNORMAL HIGH (ref 80–100)
Neutrophil %: 80 %
Platelet: 327 10*3/uL (ref 150–440)
RBC: 3.37 10*6/uL — ABNORMAL LOW (ref 4.40–5.90)
WBC: 20.8 10*3/uL — ABNORMAL HIGH (ref 3.8–10.6)

## 2013-01-04 LAB — PHOSPHORUS: Phosphorus: 4.7 mg/dL (ref 2.5–4.9)

## 2013-01-05 LAB — MAGNESIUM: Magnesium: 1.8 mg/dL

## 2013-01-05 LAB — BASIC METABOLIC PANEL
Chloride: 114 mmol/L — ABNORMAL HIGH (ref 98–107)
Co2: 27 mmol/L (ref 21–32)
Creatinine: 1.42 mg/dL — ABNORMAL HIGH (ref 0.60–1.30)
EGFR (African American): >60
Potassium: 3.3 mmol/L — ABNORMAL LOW (ref 3.5–5.1)
Sodium: 149 mmol/L — ABNORMAL HIGH (ref 136–145)

## 2013-01-05 LAB — WBC
WBC: 10.4 10*3/uL (ref 3.8–10.6)
WBC: 12.4 10*3/uL — ABNORMAL HIGH (ref 3.8–10.6)

## 2013-01-06 ENCOUNTER — Other Ambulatory Visit (HOSPITAL_COMMUNITY): Payer: PRIVATE HEALTH INSURANCE

## 2013-01-06 ENCOUNTER — Ambulatory Visit (HOSPITAL_COMMUNITY)
Admission: AD | Admit: 2013-01-06 | Discharge: 2013-01-06 | Disposition: A | Discharge status: Home / Self Care | Payer: PRIVATE HEALTH INSURANCE | Source: Other Acute Inpatient Hospital | Attending: Internal Medicine | Admitting: Internal Medicine

## 2013-01-06 ENCOUNTER — Inpatient Hospital Stay
Admission: AD | Admit: 2013-01-06 | Discharge: 2013-03-09 | Disposition: A | Discharge status: Skilled Nursing Facility | Payer: PRIVATE HEALTH INSURANCE | Source: Ambulatory Visit | Attending: Internal Medicine | Admitting: Internal Medicine

## 2013-01-06 DIAGNOSIS — J9600 Acute respiratory failure, unspecified whether with hypoxia or hypercapnia: Secondary | ICD-10-CM | POA: Insufficient documentation

## 2013-01-06 DIAGNOSIS — R509 Fever, unspecified: Secondary | ICD-10-CM

## 2013-01-06 DIAGNOSIS — D649 Anemia, unspecified: Secondary | ICD-10-CM

## 2013-01-06 DIAGNOSIS — J189 Pneumonia, unspecified organism: Secondary | ICD-10-CM

## 2013-01-06 DIAGNOSIS — Z930 Tracheostomy status: Secondary | ICD-10-CM

## 2013-01-06 DIAGNOSIS — R5381 Other malaise: Secondary | ICD-10-CM

## 2013-01-06 DIAGNOSIS — Z9911 Dependence on respirator [ventilator] status: Secondary | ICD-10-CM | POA: Insufficient documentation

## 2013-01-06 DIAGNOSIS — A419 Sepsis, unspecified organism: Secondary | ICD-10-CM

## 2013-01-06 DIAGNOSIS — I4892 Unspecified atrial flutter: Secondary | ICD-10-CM

## 2013-01-06 DIAGNOSIS — J9610 Chronic respiratory failure, unspecified whether with hypoxia or hypercapnia: Secondary | ICD-10-CM

## 2013-01-06 HISTORY — DX: Quadriplegia, unspecified: G82.50

## 2013-01-06 LAB — BASIC METABOLIC PANEL
Anion Gap: 5 — ABNORMAL LOW (ref 7–16)
BUN: 27 mg/dL — ABNORMAL HIGH (ref 7–18)
EGFR (African American): >60
Glucose: 229 mg/dL — ABNORMAL HIGH (ref 65–99)
Potassium: 4.1 mmol/L (ref 3.5–5.1)
Sodium: 142 mmol/L (ref 136–145)

## 2013-01-06 LAB — BLOOD GAS, ARTERIAL
MECHVT: 0.5 mL
O2 Saturation: 92.3 %
PEEP: 5 cmH2O
Patient temperature: 98.6
RATE: 12 resp/min
TCO2: 28.1 mmol/L (ref 0–100)
pCO2 arterial: 39.3 mmHg (ref 35.0–45.0)
pH, Arterial: 7.449 (ref 7.350–7.450)

## 2013-01-06 LAB — MAGNESIUM: Magnesium: 1.5 mg/dL — ABNORMAL LOW

## 2013-01-06 LAB — PHOSPHORUS: Phosphorus: 3 mg/dL (ref 2.5–4.9)

## 2013-01-06 MED ORDER — IOHEXOL 300 MG/ML  SOLN
50.0000 mL | Freq: Once | INTRAMUSCULAR | Status: AC | PRN
  Administered 2013-01-06: 20 mL via ORAL
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
## 2013-01-07 ENCOUNTER — Other Ambulatory Visit (HOSPITAL_COMMUNITY): Payer: PRIVATE HEALTH INSURANCE

## 2013-01-07 LAB — COMPREHENSIVE METABOLIC PANEL
ALT: 28 U/L (ref 0–53)
Albumin: 2.2 g/dL — ABNORMAL LOW (ref 3.5–5.2)
Alkaline Phosphatase: 148 U/L — ABNORMAL HIGH (ref 39–117)
BUN: 26 mg/dL — ABNORMAL HIGH (ref 6–23)
Chloride: 103 mEq/L (ref 96–112)
GFR calc Af Amer: >90 mL/min (ref >90)
Glucose, Bld: 100 mg/dL — ABNORMAL HIGH (ref 70–99)
Potassium: 4.9 mEq/L (ref 3.5–5.1)
Sodium: 145 mEq/L (ref 135–145)
Total Bilirubin: 0.7 mg/dL (ref 0.3–1.2)
Total Protein: 10 g/dL — ABNORMAL HIGH (ref 6.0–8.3)

## 2013-01-07 LAB — CBC
HCT: 36.9 % — ABNORMAL LOW (ref 39.0–52.0)
Hemoglobin: 12 g/dL — ABNORMAL LOW (ref 13.0–17.0)
MCHC: 32.5 g/dL (ref 30.0–36.0)
Platelets: 351 10*3/uL (ref 150–400)
RDW: 16.4 % — ABNORMAL HIGH (ref 11.5–15.5)
WBC: 12.3 10*3/uL — ABNORMAL HIGH (ref 4.0–10.5)

## 2013-01-07 LAB — PROCALCITONIN: Procalcitonin: 0.34 ng/mL

## 2013-01-09 ENCOUNTER — Other Ambulatory Visit (HOSPITAL_COMMUNITY): Payer: PRIVATE HEALTH INSURANCE

## 2013-01-09 DIAGNOSIS — J9610 Chronic respiratory failure, unspecified whether with hypoxia or hypercapnia: Secondary | ICD-10-CM

## 2013-01-09 DIAGNOSIS — Z930 Tracheostomy status: Secondary | ICD-10-CM

## 2013-01-09 DIAGNOSIS — J189 Pneumonia, unspecified organism: Secondary | ICD-10-CM

## 2013-01-09 LAB — CBC
HCT: 24.6 % — ABNORMAL LOW (ref 39.0–52.0)
MCH: 32.5 pg (ref 26.0–34.0)
MCHC: 31.7 g/dL (ref 30.0–36.0)
MCV: 102.5 fL — ABNORMAL HIGH (ref 78.0–100.0)
Platelets: 288 10*3/uL (ref 150–400)
RDW: 17 % — ABNORMAL HIGH (ref 11.5–15.5)
WBC: 16.1 10*3/uL — ABNORMAL HIGH (ref 4.0–10.5)

## 2013-01-09 LAB — BASIC METABOLIC PANEL
BUN: 25 mg/dL — ABNORMAL HIGH (ref 6–23)
Calcium: 8.8 mg/dL (ref 8.4–10.5)
Chloride: 103 mEq/L (ref 96–112)
GFR calc Af Amer: >90 mL/min (ref >90)
GFR calc non Af Amer: >90 mL/min (ref >90)
Potassium: 4 mEq/L (ref 3.5–5.1)

## 2013-01-09 LAB — HEPATIC FUNCTION PANEL
ALT: 20 U/L (ref 0–53)
AST: 38 U/L — ABNORMAL HIGH (ref 0–37)
Alkaline Phosphatase: 126 U/L — ABNORMAL HIGH (ref 39–117)
Bilirubin, Direct: 0.2 mg/dL (ref 0.0–0.3)
Indirect Bilirubin: 0.5 mg/dL (ref 0.3–0.9)
Total Bilirubin: 0.7 mg/dL (ref 0.3–1.2)

## 2013-01-09 LAB — CLOSTRIDIUM DIFFICILE BY PCR: Toxigenic C. Difficile by PCR: NEGATIVE

## 2013-01-09 LAB — LIPASE, BLOOD: Lipase: 33 U/L (ref 11–59)
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
## 2013-01-10 LAB — URINE CULTURE: Colony Count: 85000

## 2013-01-11 LAB — CBC
HCT: 30.7 % — ABNORMAL LOW (ref 39.0–52.0)
Platelets: 353 10*3/uL (ref 150–400)
RBC: 3.05 MIL/uL — ABNORMAL LOW (ref 4.22–5.81)
RDW: 16.7 % — ABNORMAL HIGH (ref 11.5–15.5)
WBC: 15.1 10*3/uL — ABNORMAL HIGH (ref 4.0–10.5)

## 2013-01-11 LAB — OCCULT BLOOD X 1 CARD TO LAB, STOOL
Fecal Occult Bld: NEGATIVE
Fecal Occult Bld: NEGATIVE
Fecal Occult Bld: NEGATIVE

## 2013-01-11 LAB — BASIC METABOLIC PANEL
CO2: 31 mEq/L (ref 19–32)
Calcium: 9 mg/dL (ref 8.4–10.5)
Chloride: 102 mEq/L (ref 96–112)
Creatinine, Ser: 0.99 mg/dL (ref 0.50–1.35)
GFR calc Af Amer: >90 mL/min (ref >90)
Sodium: 144 mEq/L (ref 135–145)

## 2013-01-12 DIAGNOSIS — J9600 Acute respiratory failure, unspecified whether with hypoxia or hypercapnia: Secondary | ICD-10-CM

## 2013-01-12 DIAGNOSIS — R5381 Other malaise: Secondary | ICD-10-CM

## 2013-01-12 DIAGNOSIS — A419 Sepsis, unspecified organism: Secondary | ICD-10-CM

## 2013-01-12 LAB — CBC
HCT: 30.3 % — ABNORMAL LOW (ref 39.0–52.0)
Hemoglobin: 9.3 g/dL — ABNORMAL LOW (ref 13.0–17.0)
RBC: 3.01 MIL/uL — ABNORMAL LOW (ref 4.22–5.81)
WBC: 10.3 10*3/uL (ref 4.0–10.5)

## 2013-01-12 LAB — BASIC METABOLIC PANEL
BUN: 35 mg/dL — ABNORMAL HIGH (ref 6–23)
CO2: 32 mEq/L (ref 19–32)
Calcium: 9.2 mg/dL (ref 8.4–10.5)
Chloride: 106 mEq/L (ref 96–112)
GFR calc non Af Amer: 88 mL/min — ABNORMAL LOW (ref >90)
Glucose, Bld: 160 mg/dL — ABNORMAL HIGH (ref 70–99)
Potassium: 3.3 mEq/L — ABNORMAL LOW (ref 3.5–5.1)
Sodium: 148 mEq/L — ABNORMAL HIGH (ref 135–145)
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
## 2013-01-13 LAB — BASIC METABOLIC PANEL
Chloride: 105 mEq/L (ref 96–112)
GFR calc Af Amer: >90 mL/min (ref >90)
GFR calc non Af Amer: >90 mL/min (ref >90)
Potassium: 3.9 mEq/L (ref 3.5–5.1)
Sodium: 144 mEq/L (ref 135–145)

## 2013-01-14 ENCOUNTER — Other Ambulatory Visit (HOSPITAL_COMMUNITY): Payer: PRIVATE HEALTH INSURANCE

## 2013-01-14 LAB — CBC
HCT: 31.9 % — ABNORMAL LOW (ref 39.0–52.0)
Hemoglobin: 9.7 g/dL — ABNORMAL LOW (ref 13.0–17.0)
MCHC: 30.4 g/dL (ref 30.0–36.0)
RBC: 3.13 MIL/uL — ABNORMAL LOW (ref 4.22–5.81)
RDW: 16.7 % — ABNORMAL HIGH (ref 11.5–15.5)
WBC: 15.1 10*3/uL — ABNORMAL HIGH (ref 4.0–10.5)
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
## 2013-01-15 LAB — BASIC METABOLIC PANEL
BUN: 34 mg/dL — ABNORMAL HIGH (ref 6–23)
Chloride: 109 mEq/L (ref 96–112)
GFR calc Af Amer: >90 mL/min (ref >90)
GFR calc non Af Amer: >90 mL/min (ref >90)
Glucose, Bld: 191 mg/dL — ABNORMAL HIGH (ref 70–99)
Potassium: 4.2 mEq/L (ref 3.5–5.1)
Sodium: 146 mEq/L — ABNORMAL HIGH (ref 135–145)

## 2013-01-15 LAB — CULTURE, BLOOD (ROUTINE X 2): Culture: NO GROWTH

## 2013-01-16 ENCOUNTER — Other Ambulatory Visit (HOSPITAL_COMMUNITY): Payer: PRIVATE HEALTH INSURANCE

## 2013-01-16 LAB — CBC
HCT: 31.5 % — ABNORMAL LOW (ref 39.0–52.0)
Hemoglobin: 9.6 g/dL — ABNORMAL LOW (ref 13.0–17.0)
MCH: 31.1 pg (ref 26.0–34.0)
MCHC: 30.5 g/dL (ref 30.0–36.0)
Platelets: 275 10*3/uL (ref 150–400)
RBC: 3.09 MIL/uL — ABNORMAL LOW (ref 4.22–5.81)
WBC: 13.6 10*3/uL — ABNORMAL HIGH (ref 4.0–10.5)

## 2013-01-16 LAB — BASIC METABOLIC PANEL
BUN: 32 mg/dL — ABNORMAL HIGH (ref 6–23)
CO2: 28 mEq/L (ref 19–32)
Calcium: 9.4 mg/dL (ref 8.4–10.5)
Chloride: 111 mEq/L (ref 96–112)
GFR calc non Af Amer: >90 mL/min (ref >90)
Glucose, Bld: 191 mg/dL — ABNORMAL HIGH (ref 70–99)
Potassium: 4.1 mEq/L (ref 3.5–5.1)
Sodium: 149 mEq/L — ABNORMAL HIGH (ref 135–145)

## 2013-01-16 LAB — CULTURE, BLOOD (ROUTINE X 2)

## 2013-01-17 ENCOUNTER — Other Ambulatory Visit (HOSPITAL_COMMUNITY): Payer: PRIVATE HEALTH INSURANCE

## 2013-01-17 LAB — URINALYSIS, ROUTINE W REFLEX MICROSCOPIC
Bilirubin Urine: NEGATIVE
Glucose, UA: NEGATIVE mg/dL
Protein, ur: 100 mg/dL — AB
Urobilinogen, UA: 0.2 mg/dL (ref 0.0–1.0)
pH: 7 (ref 5.0–8.0)

## 2013-01-17 LAB — BASIC METABOLIC PANEL
BUN: 39 mg/dL — ABNORMAL HIGH (ref 6–23)
CO2: 23 mEq/L (ref 19–32)
Calcium: 9 mg/dL (ref 8.4–10.5)
Chloride: 110 mEq/L (ref 96–112)
Creatinine, Ser: 0.87 mg/dL (ref 0.50–1.35)
GFR calc Af Amer: >90 mL/min (ref >90)
Glucose, Bld: 198 mg/dL — ABNORMAL HIGH (ref 70–99)
Potassium: 4.4 mEq/L (ref 3.5–5.1)

## 2013-01-17 LAB — URINE MICROSCOPIC-ADD ON

## 2013-01-17 LAB — PROCALCITONIN: Procalcitonin: 0.17 ng/mL

## 2013-01-17 LAB — CBC
HCT: 29 % — ABNORMAL LOW (ref 39.0–52.0)
Hemoglobin: 9 g/dL — ABNORMAL LOW (ref 13.0–17.0)
MCH: 32 pg (ref 26.0–34.0)
MCHC: 31 g/dL (ref 30.0–36.0)
MCV: 103.2 fL — ABNORMAL HIGH (ref 78.0–100.0)
RBC: 2.81 MIL/uL — ABNORMAL LOW (ref 4.22–5.81)

## 2013-01-17 LAB — MAGNESIUM: Magnesium: 2.1 mg/dL (ref 1.5–2.5)

## 2013-01-18 ENCOUNTER — Encounter: Payer: Self-pay | Admitting: Physician Assistant

## 2013-01-18 DIAGNOSIS — I369 Nonrheumatic tricuspid valve disorder, unspecified: Secondary | ICD-10-CM

## 2013-01-18 LAB — BASIC METABOLIC PANEL
Calcium: 8.6 mg/dL (ref 8.4–10.5)
Chloride: 110 mEq/L (ref 96–112)
GFR calc non Af Amer: >90 mL/min (ref >90)
Glucose, Bld: 176 mg/dL — ABNORMAL HIGH (ref 70–99)
Sodium: 144 mEq/L (ref 135–145)

## 2013-01-18 LAB — CBC
HCT: 27.8 % — ABNORMAL LOW (ref 39.0–52.0)
Hemoglobin: 8.6 g/dL — ABNORMAL LOW (ref 13.0–17.0)
MCH: 31.7 pg (ref 26.0–34.0)
MCHC: 30.9 g/dL (ref 30.0–36.0)
MCV: 102.6 fL — ABNORMAL HIGH (ref 78.0–100.0)
Platelets: 213 10*3/uL (ref 150–400)
RBC: 2.71 MIL/uL — ABNORMAL LOW (ref 4.22–5.81)

## 2013-01-18 LAB — URINE CULTURE: Colony Count: >=100000

## 2013-01-18 MED FILL — Medication: Qty: 1 | Status: AC

## 2013-01-18 NOTE — Consult Note (Signed)
The patient was seen and examined, and I agree with the assessment and plan as documented above. Pt is currently in 2:1 atrial flutter and is relatively normotensive, with only symptoms being palpitations. This is in the context of E. Coli UTI and LLL pneumonia, as well as Hgb of 8.6. He's also reportedly had intermittent fevers. Numerous aniarrhythmic therapies have been attempted (adenosine, IV and oral metoprolol, IV amiodarone load, IV and oral diltiazem, and digoxin). Currently, he is on an amiodarone infusion and metoprolol via PEG. He has a low CHADS-Vasc score, thus very low risk for thromboembolic disease and would thus not require long-term anticoagulation. Would not add heparin at present given both documented allergy as well as Hgb of 8.6, which has continued to drop with IV hydration. Would recommend adding short-acting diltiazem 60 mg qid for the time being, as underlying provoking factors (most notably infections) are treated. Would continue amiodarone infusion for the time being, in addition to the metoprolol via PEG. Until these infections resolve to a greater degree (particularly pneumonia), even if he were to be cardioverted, he would likely revert back into flutter relatively quickly. However, given that he is normotensive and only c/o palpitations, no need for urgent cardioversion at present. TSH normal on 10/15. Would recommend obtaining 2-D echocardiogram with Doppler to evaluate LV systolic function and for valvular heart disease, as well as left atrial dimensions. We will continue to follow with you.
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
## 2013-01-19 ENCOUNTER — Other Ambulatory Visit (HOSPITAL_COMMUNITY): Payer: Self-pay

## 2013-01-19 DIAGNOSIS — I4892 Unspecified atrial flutter: Secondary | ICD-10-CM

## 2013-01-19 LAB — CBC
HCT: 28.6 % — ABNORMAL LOW (ref 39.0–52.0)
MCH: 31.9 pg (ref 26.0–34.0)
MCHC: 31.8 g/dL (ref 30.0–36.0)
RDW: 16.9 % — ABNORMAL HIGH (ref 11.5–15.5)

## 2013-01-19 LAB — CULTURE, RESPIRATORY

## 2013-01-19 LAB — BASIC METABOLIC PANEL
BUN: 25 mg/dL — ABNORMAL HIGH (ref 6–23)
Calcium: 8.4 mg/dL (ref 8.4–10.5)
Creatinine, Ser: 0.74 mg/dL (ref 0.50–1.35)
GFR calc Af Amer: >90 mL/min (ref >90)
GFR calc non Af Amer: >90 mL/min (ref >90)
Potassium: 4 mEq/L (ref 3.5–5.1)
Sodium: 139 mEq/L (ref 135–145)

## 2013-01-19 LAB — CULTURE, RESPIRATORY W GRAM STAIN

## 2013-01-19 LAB — VANCOMYCIN, TROUGH: Vancomycin Tr: 44 ug/mL (ref 10.0–20.0)

## 2013-01-19 NOTE — Progress Notes (Signed)
Subjective: No CP Objective: There were no vitals filed for this visit. Weight change:  No intake or output data in the 24 hours ending 01/19/13 1047  General: Alert, awaked   in no acute distress Neck:  JVP is difficult to assess Heart: Regular rate and rhythm, without murmurs, rubs, gallops.  Lungs: Clear to auscultation.  No rales or wheezes. Exemities:  No edema.    Tele:  SR     Lab Results: Results for orders placed during the hospital encounter of 01/06/13 (from the past 24 hour(s))  VANCOMYCIN, TROUGH     Status: Abnormal   Collection Time    01/19/13  5:00 AM      Result Value Range   Vancomycin Tr 44.0 (*) 10.0 - 20.0 ug/mL  CBC     Status: Abnormal   Collection Time    01/19/13  5:00 AM      Result Value Range   WBC 12.0 (*) 4.0 - 10.5 K/uL   RBC 2.85 (*) 4.22 - 5.81 MIL/uL   Hemoglobin 9.1 (*) 13.0 - 17.0 g/dL   HCT 16.1 (*) 09.6 - 04.5 %   MCV 100.4 (*) 78.0 - 100.0 fL   MCH 31.9  26.0 - 34.0 pg   MCHC 31.8  30.0 - 36.0 g/dL   RDW 40.9 (*) 81.1 - 91.4 %   Platelets 206  150 - 400 K/uL  BASIC METABOLIC PANEL     Status: Abnormal   Collection Time    01/19/13  5:00 AM      Result Value Range   Sodium 139  135 - 145 mEq/L   Potassium 4.0  3.5 - 5.1 mEq/L   Chloride 106  96 - 112 mEq/L   CO2 23  19 - 32 mEq/L   Glucose, Bld 247 (*) 70 - 99 mg/dL   BUN 25 (*) 6 - 23 mg/dL   Creatinine, Ser 7.82  0.50 - 1.35 mg/dL   Calcium 8.4  8.4 - 95.6 mg/dL   GFR calc non Af Amer >90  >90 mL/min   GFR calc Af Amer >90  >90 mL/min    Studies/Results: @RISRSLT24 @  Medications: Reviewed   @PROBHOSP @  LOS: 13 days   1.  Atrial flutter  Patient converted to SR this AM  Echo with normal LV function.  Rec:  I would keep on amio  Can switch to PO  400 bid.   D/C dig.  Keep on dilt for now and metoprolol  Will prob taper. Not on anticoagulation   Dietrich Pates 01/19/2013, 10:47 AM

## 2013-01-20 LAB — CBC
MCH: 32 pg (ref 26.0–34.0)
MCHC: 32 g/dL (ref 30.0–36.0)
MCV: 100 fL (ref 78.0–100.0)
Platelets: 168 10*3/uL (ref 150–400)
WBC: 10.2 10*3/uL (ref 4.0–10.5)

## 2013-01-20 LAB — BASIC METABOLIC PANEL
Calcium: 8.6 mg/dL (ref 8.4–10.5)
Chloride: 104 mEq/L (ref 96–112)
Creatinine, Ser: 0.7 mg/dL (ref 0.50–1.35)
GFR calc Af Amer: >90 mL/min (ref >90)
GFR calc non Af Amer: >90 mL/min (ref >90)
Sodium: 136 mEq/L (ref 135–145)

## 2013-01-20 LAB — MAGNESIUM: Magnesium: 1.7 mg/dL (ref 1.5–2.5)

## 2013-01-20 NOTE — Progress Notes (Signed)
Subjective: No CP  Objective: There were no vitals filed for this visit. Weight change:  No intake or output data in the 24 hours ending 01/20/13 0809  General: Alert, awaked   in no acute distress Neck:  JVP is difficult to assess Heart: Regular rate and rhythm, without murmurs, rubs, gallops.  Lungs: Clear to auscultation.  No rales or wheezes. Exemities:  No edema.    Tele:  SR    Lab Results: Results for orders placed during the hospital encounter of 01/06/13 (from the past 24 hour(s))  BASIC METABOLIC PANEL     Status: Abnormal   Collection Time    01/20/13  5:30 AM      Result Value Range   Sodium 136  135 - 145 mEq/L   Potassium 3.5  3.5 - 5.1 mEq/L   Chloride 104  96 - 112 mEq/L   CO2 24  19 - 32 mEq/L   Glucose, Bld 213 (*) 70 - 99 mg/dL   BUN 25 (*) 6 - 23 mg/dL   Creatinine, Ser 6.21  0.50 - 1.35 mg/dL   Calcium 8.6  8.4 - 30.8 mg/dL   GFR calc non Af Amer >90  >90 mL/min   GFR calc Af Amer >90  >90 mL/min  MAGNESIUM     Status: None   Collection Time    01/20/13  5:30 AM      Result Value Range   Magnesium 1.7  1.5 - 2.5 mg/dL  VANCOMYCIN, TROUGH     Status: None   Collection Time    01/20/13  5:30 AM      Result Value Range   Vancomycin Tr 17.3  10.0 - 20.0 ug/mL    Studies/Results: @RISRSLT24 @  Medications: Reviewed   @PROBHOSP @  LOS: 14 days   1.  Atrial flutter  Patient converted to SR yesterday at 7 am, still in SR Echo with normal LV function.  Rec: We will switch to PO Amiodarone today, starting with 400 mg BID x 5 days, then 200 mg po bid x 7 days, followed by 200 mg po daily Keep on dilt for now and metoprolol  Will prob taper. Not on anticoagulation   [name], H 01/20/2013, 8:09 AM
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
## 2013-01-21 ENCOUNTER — Other Ambulatory Visit (HOSPITAL_COMMUNITY): Payer: PRIVATE HEALTH INSURANCE

## 2013-01-21 LAB — CBC WITH DIFFERENTIAL/PLATELET
Basophils Relative: 0 % (ref 0–1)
Eosinophils Absolute: 1 10*3/uL — ABNORMAL HIGH (ref 0.0–0.7)
HCT: 27.8 % — ABNORMAL LOW (ref 39.0–52.0)
Hemoglobin: 8.9 g/dL — ABNORMAL LOW (ref 13.0–17.0)
Lymphocytes Relative: 27 % (ref 12–46)
Lymphs Abs: 2.7 10*3/uL (ref 0.7–4.0)
MCH: 32 pg (ref 26.0–34.0)
MCHC: 32 g/dL (ref 30.0–36.0)
Monocytes Absolute: 1.1 10*3/uL — ABNORMAL HIGH (ref 0.1–1.0)
Monocytes Relative: 11 % (ref 3–12)
Neutro Abs: 5.3 10*3/uL (ref 1.7–7.7)
Neutrophils Relative %: 52 % (ref 43–77)
RBC: 2.78 MIL/uL — ABNORMAL LOW (ref 4.22–5.81)
WBC: 10.1 10*3/uL (ref 4.0–10.5)

## 2013-01-21 NOTE — Progress Notes (Signed)
    Subjective: No CP  Objective: HR 60-80 BPM General: Alert, awaked   in no acute distress Neck:  JVP is difficult to assess Heart: Regular rate and rhythm, without murmurs, rubs, gallops.  Lungs: Clear to auscultation.  No rales or wheezes. Exemities:  No edema.    Tele:  SR    Lab Results: Results for orders placed during the hospital encounter of 01/06/13 (from the past 24 hour(s))  WOUND CULTURE     Status: None   Collection Time    01/20/13 11:58 AM      Result Value Range   Specimen Description WOUND ABDOMEN     Special Requests SUPRAPUBIC CATH SITE     Gram Stain PENDING     Culture       Value: NO GROWTH     Performed at [facility]   Report Status PENDING    CBC     Status: Abnormal   Collection Time    01/20/13 12:35 PM      Result Value Range   WBC 10.2  4.0 - 10.5 K/uL   RBC 2.69 (*) 4.22 - 5.81 MIL/uL   Hemoglobin 8.6 (*) 13.0 - 17.0 g/dL   HCT 16.1 (*) 09.6 - 04.5 %   MCV 100.0  78.0 - 100.0 fL   MCH 32.0  26.0 - 34.0 pg   MCHC 32.0  30.0 - 36.0 g/dL   RDW 40.9 (*) 81.1 - 91.4 %   Platelets 168  150 - 400 K/uL  CBC WITH DIFFERENTIAL     Status: Abnormal   Collection Time    01/21/13  6:15 AM      Result Value Range   WBC 10.1  4.0 - 10.5 K/uL   RBC 2.78 (*) 4.22 - 5.81 MIL/uL   Hemoglobin 8.9 (*) 13.0 - 17.0 g/dL   HCT 78.2 (*) 95.6 - 21.3 %   MCV 100.0  78.0 - 100.0 fL   MCH 32.0  26.0 - 34.0 pg   MCHC 32.0  30.0 - 36.0 g/dL   RDW 08.6 (*) 57.8 - 46.9 %   Platelets 188  150 - 400 K/uL   Neutrophils Relative % 52  43 - 77 %   Neutro Abs 5.3  1.7 - 7.7 K/uL   Lymphocytes Relative 27  12 - 46 %   Lymphs Abs 2.7  0.7 - 4.0 K/uL   Monocytes Relative 11  3 - 12 %   Monocytes Absolute 1.1 (*) 0.1 - 1.0 K/uL   Eosinophils Relative 10 (*) 0 - 5 %   Eosinophils Absolute 1.0 (*) 0.0 - 0.7 K/uL   Basophils Relative 0  0 - 1 %   Basophils Absolute 0.0  0.0 - 0.1 K/uL    Medications: Reviewed  @PROBHOSP @  LOS: 15 days     Assessment  and Plan:  1.  Atrial flutter: The patient is in SR for the last 48 hours (converted spontaneously on 01/19/13 at 7 am) Echo with normal LV function.  We will continue PO Amiodarone, with this schedule  400 mg BID x 5 days (started on 01/20/2013),  then 200 mg po bid x 7 days,  followed by 200 mg po daily  Keep on diltiazem and metoprolol. Not on anticoagulation.   Tobias Alexander, H 01/21/2013, 8:43 AM

## 2013-01-22 LAB — DIGOXIN LEVEL: Digoxin Level: 1.4 ng/mL (ref 0.8–2.0)

## 2013-01-22 NOTE — Progress Notes (Signed)
    Subjective: No CP or palpitations  Objective: HR 60-80 BPM General: Alert, awaked   in no acute distress Neck:  JVP not elevated, tracheostomy in place Heart: Regular rate and rhythm, without murmurs, rubs, gallops.  Lungs: Clear to auscultation.  No wheezes. MInimal rales Exemities:  No edema.    Tele:  SR    Lab Results: Results for orders placed during the hospital encounter of 01/06/13 (from the past 24 hour(s))  DIGOXIN LEVEL     Status: None   Collection Time    01/22/13  6:00 AM      Result Value Range   Digoxin Level 1.4  0.8 - 2.0 ng/mL    Medications: Reviewed  @PROBHOSP @  LOS: 16 days     Assessment and Plan:  1.  Atrial flutter: The patient is in SR for the last 72 hours (converted spontaneously on 01/19/13 at 7 am) Echo with normal LV function.  We will continue PO Amiodarone, with this schedule  400 mg BID x 5 days (started on 01/20/2013),  then 200 mg po bid x 7 days,  followed by 200 mg po daily  Keep on diltiazem and metoprolol. Not on anticoagulation.   Tobias Alexander, H 01/22/2013, 9:16 AM

## 2013-01-23 LAB — WOUND CULTURE

## 2013-01-23 LAB — CULTURE, BLOOD (ROUTINE X 2)

## 2013-01-23 LAB — CBC
Hemoglobin: 8.2 g/dL — ABNORMAL LOW (ref 13.0–17.0)
MCH: 31.3 pg (ref 26.0–34.0)
MCHC: 31.7 g/dL (ref 30.0–36.0)
MCV: 98.9 fL (ref 78.0–100.0)
RBC: 2.62 MIL/uL — ABNORMAL LOW (ref 4.22–5.81)
RDW: 17 % — ABNORMAL HIGH (ref 11.5–15.5)

## 2013-01-23 LAB — BASIC METABOLIC PANEL
BUN: 26 mg/dL — ABNORMAL HIGH (ref 6–23)
CO2: 27 mEq/L (ref 19–32)
GFR calc non Af Amer: >90 mL/min (ref >90)
Glucose, Bld: 195 mg/dL — ABNORMAL HIGH (ref 70–99)
Potassium: 3.9 mEq/L (ref 3.5–5.1)

## 2013-01-23 NOTE — Progress Notes (Signed)
    Subjective: No CP or palpitations  Objective: HR 60-80 BPM General: Alert, awaked   in no acute distress Neck:  JVP not elevated, tracheostomy in place Heart: Regular rate and rhythm, without murmurs, rubs, gallops.  Lungs: Clear to auscultation.  No wheezes. MInimal rales Exemities:  No edema.    Tele:  SR    Lab Results: Results for orders placed during the hospital encounter of 01/06/13 (from the past 24 hour(s))  BASIC METABOLIC PANEL     Status: Abnormal   Collection Time    01/23/13  5:00 AM      Result Value Range   Sodium 141  135 - 145 mEq/L   Potassium 3.9  3.5 - 5.1 mEq/L   Chloride 107  96 - 112 mEq/L   CO2 27  19 - 32 mEq/L   Glucose, Bld 195 (*) 70 - 99 mg/dL   BUN 26 (*) 6 - 23 mg/dL   Creatinine, Ser 4.01  0.50 - 1.35 mg/dL   Calcium 8.8  8.4 - 02.7 mg/dL   GFR calc non Af Amer >90  >90 mL/min   GFR calc Af Amer >90  >90 mL/min  CBC     Status: Abnormal   Collection Time    01/23/13  5:00 AM      Result Value Range   WBC 9.1  4.0 - 10.5 K/uL   RBC 2.62 (*) 4.22 - 5.81 MIL/uL   Hemoglobin 8.2 (*) 13.0 - 17.0 g/dL   HCT 25.3 (*) 66.4 - 40.3 %   MCV 98.9  78.0 - 100.0 fL   MCH 31.3  26.0 - 34.0 pg   MCHC 31.7  30.0 - 36.0 g/dL   RDW 47.4 (*) 25.9 - 56.3 %   Platelets 210  150 - 400 K/uL     Assessment and Plan:  1.  Atrial flutter: The patient is in SR for the last 4 days (converted spontaneously on 01/19/13 at 7 am) Echo with normal LV function.  We will continue PO Amiodarone, with this schedule  400 mg BID x 5 days (started on 01/20/2013),  then 200 mg po bid x 7 days,  followed by 200 mg po daily  Keep on diltiazem and metoprolol. Not on anticoagulation.  We will sign off, please call us if there is any change in patients status or you have any questions.  Tobias Alexander, H 01/23/2013, 9:18 AM

## 2013-01-25 LAB — BASIC METABOLIC PANEL
BUN: 23 mg/dL (ref 6–23)
CO2: 29 mEq/L (ref 19–32)
Calcium: 9 mg/dL (ref 8.4–10.5)
Creatinine, Ser: 0.65 mg/dL (ref 0.50–1.35)
GFR calc non Af Amer: >90 mL/min (ref >90)
Glucose, Bld: 156 mg/dL — ABNORMAL HIGH (ref 70–99)
Sodium: 140 mEq/L (ref 135–145)

## 2013-01-25 LAB — CBC
MCH: 31.1 pg (ref 26.0–34.0)
MCHC: 31.5 g/dL (ref 30.0–36.0)
MCV: 98.9 fL (ref 78.0–100.0)
Platelets: 243 10*3/uL (ref 150–400)
RDW: 17.1 % — ABNORMAL HIGH (ref 11.5–15.5)
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
## 2013-01-26 ENCOUNTER — Other Ambulatory Visit (HOSPITAL_COMMUNITY): Payer: PRIVATE HEALTH INSURANCE

## 2013-01-26 LAB — CBC
HCT: 26.7 % — ABNORMAL LOW (ref 39.0–52.0)
MCV: 97.8 fL (ref 78.0–100.0)
Platelets: 274 10*3/uL (ref 150–400)
RBC: 2.73 MIL/uL — ABNORMAL LOW (ref 4.22–5.81)
RDW: 17.5 % — ABNORMAL HIGH (ref 11.5–15.5)
WBC: 11.2 10*3/uL — ABNORMAL HIGH (ref 4.0–10.5)

## 2013-01-26 LAB — BASIC METABOLIC PANEL
BUN: 25 mg/dL — ABNORMAL HIGH (ref 6–23)
CO2: 28 mEq/L (ref 19–32)
Chloride: 103 mEq/L (ref 96–112)
GFR calc Af Amer: >90 mL/min (ref >90)
Potassium: 4.3 mEq/L (ref 3.5–5.1)

## 2013-01-27 ENCOUNTER — Other Ambulatory Visit (HOSPITAL_COMMUNITY): Payer: PRIVATE HEALTH INSURANCE

## 2013-01-27 LAB — CBC
HCT: 27 % — ABNORMAL LOW (ref 39.0–52.0)
Hemoglobin: 8.5 g/dL — ABNORMAL LOW (ref 13.0–17.0)
MCV: 96.8 fL (ref 78.0–100.0)
WBC: 12.4 10*3/uL — ABNORMAL HIGH (ref 4.0–10.5)

## 2013-01-28 DIAGNOSIS — R509 Fever, unspecified: Secondary | ICD-10-CM

## 2013-01-28 DIAGNOSIS — D649 Anemia, unspecified: Secondary | ICD-10-CM
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
## 2013-01-29 LAB — CBC
HCT: 22.6 % — ABNORMAL LOW (ref 39.0–52.0)
MCHC: 32.3 g/dL (ref 30.0–36.0)
MCV: 96.6 fL (ref 78.0–100.0)
RBC: 2.34 MIL/uL — ABNORMAL LOW (ref 4.22–5.81)
WBC: 8.9 10*3/uL (ref 4.0–10.5)

## 2013-01-29 LAB — BASIC METABOLIC PANEL
BUN: 29 mg/dL — ABNORMAL HIGH (ref 6–23)
CO2: 22 mEq/L (ref 19–32)
Calcium: 6.6 mg/dL — ABNORMAL LOW (ref 8.4–10.5)
Chloride: 108 mEq/L (ref 96–112)
Creatinine, Ser: 0.67 mg/dL (ref 0.50–1.35)
GFR calc non Af Amer: >90 mL/min (ref >90)
Glucose, Bld: 250 mg/dL — ABNORMAL HIGH (ref 70–99)

## 2013-01-30 ENCOUNTER — Other Ambulatory Visit (HOSPITAL_COMMUNITY): Payer: PRIVATE HEALTH INSURANCE

## 2013-01-31 ENCOUNTER — Other Ambulatory Visit (HOSPITAL_COMMUNITY): Payer: Self-pay

## 2013-01-31 ENCOUNTER — Other Ambulatory Visit (HOSPITAL_COMMUNITY): Payer: PRIVATE HEALTH INSURANCE

## 2013-01-31 LAB — URINE MICROSCOPIC-ADD ON

## 2013-01-31 LAB — URINALYSIS, ROUTINE W REFLEX MICROSCOPIC
Bilirubin Urine: NEGATIVE
Glucose, UA: NEGATIVE mg/dL
Protein, ur: 100 mg/dL — AB

## 2013-01-31 LAB — CBC
HCT: 29.2 % — ABNORMAL LOW (ref 39.0–52.0)
MCH: 30 pg (ref 26.0–34.0)
MCHC: 31.2 g/dL (ref 30.0–36.0)
MCV: 96.4 fL (ref 78.0–100.0)
Platelets: 314 10*3/uL (ref 150–400)
RDW: 17.5 % — ABNORMAL HIGH (ref 11.5–15.5)
WBC: 17.8 10*3/uL — ABNORMAL HIGH (ref 4.0–10.5)

## 2013-02-02 ENCOUNTER — Other Ambulatory Visit (HOSPITAL_COMMUNITY): Payer: PRIVATE HEALTH INSURANCE

## 2013-02-02 LAB — BLOOD GAS, ARTERIAL
Bicarbonate: 27.1 mEq/L — ABNORMAL HIGH (ref 20.0–24.0)
FIO2: 0.35 %
O2 Saturation: 86.6 %
Patient temperature: 98.4

## 2013-02-02 LAB — CBC
HCT: 27.5 % — ABNORMAL LOW (ref 39.0–52.0)
Hemoglobin: 8.8 g/dL — ABNORMAL LOW (ref 13.0–17.0)
MCV: 94.8 fL (ref 78.0–100.0)
RBC: 2.9 MIL/uL — ABNORMAL LOW (ref 4.22–5.81)
WBC: 10.1 10*3/uL (ref 4.0–10.5)

## 2013-02-02 LAB — BASIC METABOLIC PANEL
Calcium: 9.2 mg/dL (ref 8.4–10.5)
Creatinine, Ser: 0.68 mg/dL (ref 0.50–1.35)
GFR calc Af Amer: >90 mL/min (ref >90)
GFR calc non Af Amer: >90 mL/min (ref >90)
Glucose, Bld: 140 mg/dL — ABNORMAL HIGH (ref 70–99)
Potassium: 3.9 mEq/L (ref 3.5–5.1)

## 2013-02-02 LAB — URINE CULTURE
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
## 2013-02-03 ENCOUNTER — Other Ambulatory Visit (HOSPITAL_COMMUNITY): Payer: PRIVATE HEALTH INSURANCE

## 2013-02-03 ENCOUNTER — Encounter: Payer: Self-pay | Admitting: Radiology

## 2013-02-03 LAB — CULTURE, RESPIRATORY W GRAM STAIN

## 2013-02-03 LAB — CULTURE, RESPIRATORY

## 2013-02-03 NOTE — H&P (Signed)
Agree with above 
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
## 2013-02-04 ENCOUNTER — Other Ambulatory Visit (HOSPITAL_COMMUNITY): Payer: PRIVATE HEALTH INSURANCE

## 2013-02-04 LAB — CULTURE, BLOOD (ROUTINE X 2)

## 2013-02-04 LAB — CBC
HCT: 26.8 % — ABNORMAL LOW (ref 39.0–52.0)
Hemoglobin: 8.3 g/dL — ABNORMAL LOW (ref 13.0–17.0)
MCH: 29.3 pg (ref 26.0–34.0)
MCHC: 31 g/dL (ref 30.0–36.0)
MCV: 94.7 fL (ref 78.0–100.0)
Platelets: 234 10*3/uL (ref 150–400)

## 2013-02-04 LAB — BASIC METABOLIC PANEL
BUN: 22 mg/dL (ref 6–23)
CO2: 23 mEq/L (ref 19–32)
Calcium: 8.2 mg/dL — ABNORMAL LOW (ref 8.4–10.5)
Creatinine, Ser: 0.59 mg/dL (ref 0.50–1.35)
Glucose, Bld: 146 mg/dL — ABNORMAL HIGH (ref 70–99)
Potassium: 3.5 mEq/L (ref 3.5–5.1)
Sodium: 137 mEq/L (ref 135–145)

## 2013-02-04 LAB — PROTIME-INR: INR: 1.16 (ref 0.00–1.49)
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
## 2013-02-05 LAB — VANCOMYCIN, TROUGH: Vancomycin Tr: 19.4 ug/mL (ref 10.0–20.0)
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
## 2013-02-06 LAB — CBC
HCT: 25.4 % — ABNORMAL LOW (ref 39.0–52.0)
Hemoglobin: 7.8 g/dL — ABNORMAL LOW (ref 13.0–17.0)
MCHC: 30.7 g/dL (ref 30.0–36.0)
RBC: 2.72 MIL/uL — ABNORMAL LOW (ref 4.22–5.81)
RDW: 17.7 % — ABNORMAL HIGH (ref 11.5–15.5)
WBC: 9.9 10*3/uL (ref 4.0–10.5)

## 2013-02-06 LAB — DIGOXIN LEVEL: Digoxin Level: 1.7 ng/mL (ref 0.8–2.0)

## 2013-02-06 LAB — BASIC METABOLIC PANEL
CO2: 26 mEq/L (ref 19–32)
Chloride: 102 mEq/L (ref 96–112)
Creatinine, Ser: 0.66 mg/dL (ref 0.50–1.35)
GFR calc Af Amer: >90 mL/min (ref >90)
GFR calc non Af Amer: >90 mL/min (ref >90)
Potassium: 3.6 mEq/L (ref 3.5–5.1)
Sodium: 137 mEq/L (ref 135–145)

## 2013-02-06 LAB — CATH TIP CULTURE

## 2013-02-07 LAB — VANCOMYCIN, TROUGH: Vancomycin Tr: 15.1 ug/mL (ref 10.0–20.0)

## 2013-02-08 LAB — BASIC METABOLIC PANEL
Calcium: 8.7 mg/dL (ref 8.4–10.5)
Chloride: 103 mEq/L (ref 96–112)
GFR calc non Af Amer: >90 mL/min (ref >90)
Glucose, Bld: 153 mg/dL — ABNORMAL HIGH (ref 70–99)
Potassium: 3.2 mEq/L — ABNORMAL LOW (ref 3.5–5.1)
Sodium: 137 mEq/L (ref 135–145)

## 2013-02-08 LAB — CBC
Hemoglobin: 8.2 g/dL — ABNORMAL LOW (ref 13.0–17.0)
MCH: 29.2 pg (ref 26.0–34.0)
Platelets: 249 10*3/uL (ref 150–400)
RBC: 2.81 MIL/uL — ABNORMAL LOW (ref 4.22–5.81)
WBC: 11.1 10*3/uL — ABNORMAL HIGH (ref 4.0–10.5)

## 2013-02-09 LAB — BASIC METABOLIC PANEL
CO2: 26 mEq/L (ref 19–32)
Calcium: 8.8 mg/dL (ref 8.4–10.5)
Chloride: 105 mEq/L (ref 96–112)
Creatinine, Ser: 0.71 mg/dL (ref 0.50–1.35)
GFR calc Af Amer: >90 mL/min (ref >90)
Glucose, Bld: 127 mg/dL — ABNORMAL HIGH (ref 70–99)
Potassium: 3.7 mEq/L (ref 3.5–5.1)
Sodium: 139 mEq/L (ref 135–145)

## 2013-02-09 LAB — VANCOMYCIN, TROUGH: Vancomycin Tr: 9.6 ug/mL — ABNORMAL LOW (ref 10.0–20.0)
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
## 2013-02-11 LAB — EXPECTORATED SPUTUM ASSESSMENT W GRAM STAIN, RFLX TO RESP C

## 2013-02-12 LAB — BASIC METABOLIC PANEL
BUN: 18 mg/dL (ref 6–23)
CO2: 25 mEq/L (ref 19–32)
Calcium: 8.3 mg/dL — ABNORMAL LOW (ref 8.4–10.5)
Chloride: 106 mEq/L (ref 96–112)
Creatinine, Ser: 0.73 mg/dL (ref 0.50–1.35)
Glucose, Bld: 87 mg/dL (ref 70–99)
Sodium: 140 mEq/L (ref 135–145)

## 2013-02-12 LAB — CBC
HCT: 21.5 % — ABNORMAL LOW (ref 39.0–52.0)
Hemoglobin: 6.7 g/dL — CL (ref 13.0–17.0)
MCH: 28.4 pg (ref 26.0–34.0)
MCHC: 31.2 g/dL (ref 30.0–36.0)
MCV: 91.1 fL (ref 78.0–100.0)
Platelets: 257 10*3/uL (ref 150–400)
RBC: 2.36 MIL/uL — ABNORMAL LOW (ref 4.22–5.81)
WBC: 8.9 10*3/uL (ref 4.0–10.5)

## 2013-02-12 LAB — PREPARE RBC (CROSSMATCH)

## 2013-02-12 LAB — HEMOGLOBIN AND HEMATOCRIT, BLOOD
HCT: 23.6 % — ABNORMAL LOW (ref 39.0–52.0)
Hemoglobin: 7.2 g/dL — ABNORMAL LOW (ref 13.0–17.0)

## 2013-02-13 LAB — CBC
HCT: 30.8 % — ABNORMAL LOW (ref 39.0–52.0)
Hemoglobin: 10.1 g/dL — ABNORMAL LOW (ref 13.0–17.0)
MCH: 29.2 pg (ref 26.0–34.0)
MCHC: 32.8 g/dL (ref 30.0–36.0)
MCV: 89 fL (ref 78.0–100.0)
Platelets: 246 10*3/uL (ref 150–400)
WBC: 8.4 10*3/uL (ref 4.0–10.5)

## 2013-02-13 LAB — BASIC METABOLIC PANEL
BUN: 15 mg/dL (ref 6–23)
Calcium: 8.7 mg/dL (ref 8.4–10.5)
Creatinine, Ser: 0.7 mg/dL (ref 0.50–1.35)
GFR calc non Af Amer: >90 mL/min (ref >90)
Glucose, Bld: 130 mg/dL — ABNORMAL HIGH (ref 70–99)

## 2013-02-14 LAB — TYPE AND SCREEN
ABO/RH(D): O POS
Antibody Screen: NEGATIVE
Unit division: 0

## 2013-02-15 LAB — CULTURE, RESPIRATORY W GRAM STAIN

## 2013-02-15 LAB — CULTURE, RESPIRATORY

## 2013-02-16 ENCOUNTER — Other Ambulatory Visit (HOSPITAL_COMMUNITY): Payer: PRIVATE HEALTH INSURANCE

## 2013-02-16 LAB — CBC
HCT: 32.5 % — ABNORMAL LOW (ref 39.0–52.0)
Hemoglobin: 10.3 g/dL — ABNORMAL LOW (ref 13.0–17.0)
MCH: 29.3 pg (ref 26.0–34.0)
MCHC: 31.7 g/dL (ref 30.0–36.0)
MCV: 92.3 fL (ref 78.0–100.0)
Platelets: 261 10*3/uL (ref 150–400)
RDW: 18 % — ABNORMAL HIGH (ref 11.5–15.5)
WBC: 9.4 10*3/uL (ref 4.0–10.5)

## 2013-02-16 LAB — BASIC METABOLIC PANEL
Calcium: 8.7 mg/dL (ref 8.4–10.5)
Creatinine, Ser: 0.75 mg/dL (ref 0.50–1.35)
GFR calc non Af Amer: >90 mL/min (ref >90)
Glucose, Bld: 124 mg/dL — ABNORMAL HIGH (ref 70–99)
Sodium: 135 mEq/L (ref 135–145)

## 2013-02-21 LAB — BASIC METABOLIC PANEL
BUN: 15 mg/dL (ref 6–23)
Calcium: 8.7 mg/dL (ref 8.4–10.5)
Chloride: 100 mEq/L (ref 96–112)
Creatinine, Ser: 0.61 mg/dL (ref 0.50–1.35)
GFR calc Af Amer: >90 mL/min (ref >90)
Glucose, Bld: 131 mg/dL — ABNORMAL HIGH (ref 70–99)
Potassium: 3.1 mEq/L — ABNORMAL LOW (ref 3.5–5.1)

## 2013-02-21 LAB — CBC
HCT: 30.9 % — ABNORMAL LOW (ref 39.0–52.0)
Hemoglobin: 9.8 g/dL — ABNORMAL LOW (ref 13.0–17.0)
MCH: 28.5 pg (ref 26.0–34.0)
MCHC: 31.7 g/dL (ref 30.0–36.0)
MCV: 89.8 fL (ref 78.0–100.0)
RDW: 17.3 % — ABNORMAL HIGH (ref 11.5–15.5)
WBC: 7.3 10*3/uL (ref 4.0–10.5)

## 2013-02-21 LAB — FERRITIN: Ferritin: 272 ng/mL (ref 22–322)

## 2013-02-21 LAB — IRON AND TIBC: Iron: 28 ug/dL — ABNORMAL LOW (ref 42–135)

## 2013-02-21 LAB — RETICULOCYTES: Retic Count, Absolute: 92.9 10*3/uL (ref 19.0–186.0)

## 2013-02-22 LAB — BASIC METABOLIC PANEL
CO2: 27 mEq/L (ref 19–32)
Calcium: 8.7 mg/dL (ref 8.4–10.5)
Chloride: 102 mEq/L (ref 96–112)
Glucose, Bld: 116 mg/dL — ABNORMAL HIGH (ref 70–99)
Sodium: 137 mEq/L (ref 135–145)

## 2013-02-23 LAB — BASIC METABOLIC PANEL
BUN: 12 mg/dL (ref 6–23)
CO2: 27 mEq/L (ref 19–32)
Chloride: 101 mEq/L (ref 96–112)
GFR calc non Af Amer: >90 mL/min (ref >90)
Glucose, Bld: 153 mg/dL — ABNORMAL HIGH (ref 70–99)
Potassium: 3.4 mEq/L — ABNORMAL LOW (ref 3.5–5.1)
Sodium: 137 mEq/L (ref 135–145)

## 2013-02-24 ENCOUNTER — Other Ambulatory Visit (HOSPITAL_COMMUNITY): Payer: PRIVATE HEALTH INSURANCE

## 2013-02-24 LAB — BASIC METABOLIC PANEL
BUN: 13 mg/dL (ref 6–23)
BUN: 14 mg/dL (ref 6–23)
CO2: 28 mEq/L (ref 19–32)
CO2: 27 mEq/L (ref 19–32)
Chloride: 99 mEq/L (ref 96–112)
Chloride: 99 mEq/L (ref 96–112)
Creatinine, Ser: 0.63 mg/dL (ref 0.50–1.35)
GFR calc Af Amer: >90 mL/min (ref >90)
GFR calc non Af Amer: >90 mL/min (ref >90)
GFR calc non Af Amer: >90 mL/min (ref >90)
Glucose, Bld: 158 mg/dL — ABNORMAL HIGH (ref 70–99)
Potassium: 5.2 mEq/L — ABNORMAL HIGH (ref 3.5–5.1)
Potassium: 4.1 mEq/L (ref 3.5–5.1)
Sodium: 133 mEq/L — ABNORMAL LOW (ref 135–145)

## 2013-02-25 LAB — BASIC METABOLIC PANEL
BUN: 16 mg/dL (ref 6–23)
CO2: 25 mEq/L (ref 19–32)
Chloride: 101 mEq/L (ref 96–112)
Creatinine, Ser: 0.64 mg/dL (ref 0.50–1.35)
GFR calc Af Amer: >90 mL/min (ref >90)
Glucose, Bld: 117 mg/dL — ABNORMAL HIGH (ref 70–99)

## 2013-02-28 ENCOUNTER — Other Ambulatory Visit (HOSPITAL_COMMUNITY): Payer: PRIVATE HEALTH INSURANCE

## 2013-02-28 LAB — BASIC METABOLIC PANEL
Calcium: 9.2 mg/dL (ref 8.4–10.5)
Creatinine, Ser: 0.59 mg/dL (ref 0.50–1.35)
GFR calc Af Amer: >90 mL/min (ref >90)
Sodium: 130 mEq/L — ABNORMAL LOW (ref 135–145)

## 2013-02-28 LAB — CBC
MCH: 28.5 pg (ref 26.0–34.0)
MCHC: 32.9 g/dL (ref 30.0–36.0)
MCV: 86.6 fL (ref 78.0–100.0)
Platelets: 274 10*3/uL (ref 150–400)
RDW: 17 % — ABNORMAL HIGH (ref 11.5–15.5)
WBC: 17 10*3/uL — ABNORMAL HIGH (ref 4.0–10.5)

## 2013-03-01 LAB — BASIC METABOLIC PANEL
Calcium: 9 mg/dL (ref 8.4–10.5)
Chloride: 93 mEq/L — ABNORMAL LOW (ref 96–112)
GFR calc Af Amer: >90 mL/min (ref >90)
Glucose, Bld: 203 mg/dL — ABNORMAL HIGH (ref 70–99)
Potassium: 3.5 mEq/L (ref 3.5–5.1)
Sodium: 130 mEq/L — ABNORMAL LOW (ref 135–145)

## 2013-03-01 LAB — CBC
Platelets: 256 10*3/uL (ref 150–400)
RBC: 3.98 MIL/uL — ABNORMAL LOW (ref 4.22–5.81)
RDW: 17 % — ABNORMAL HIGH (ref 11.5–15.5)
WBC: 14.1 10*3/uL — ABNORMAL HIGH (ref 4.0–10.5)

## 2013-03-02 ENCOUNTER — Other Ambulatory Visit (HOSPITAL_COMMUNITY): Payer: PRIVATE HEALTH INSURANCE

## 2013-03-02 LAB — CBC
HCT: 32 % — ABNORMAL LOW (ref 39.0–52.0)
Hemoglobin: 10.2 g/dL — ABNORMAL LOW (ref 13.0–17.0)
MCH: 27.9 pg (ref 26.0–34.0)
RBC: 3.65 MIL/uL — ABNORMAL LOW (ref 4.22–5.81)
RDW: 16.9 % — ABNORMAL HIGH (ref 11.5–15.5)
WBC: 12.8 10*3/uL — ABNORMAL HIGH (ref 4.0–10.5)

## 2013-03-03 ENCOUNTER — Other Ambulatory Visit (HOSPITAL_COMMUNITY): Payer: PRIVATE HEALTH INSURANCE

## 2013-03-03 LAB — BASIC METABOLIC PANEL
BUN: 22 mg/dL (ref 6–23)
CO2: 27 mEq/L (ref 19–32)
Chloride: 96 mEq/L (ref 96–112)
Creatinine, Ser: 0.69 mg/dL (ref 0.50–1.35)
GFR calc non Af Amer: >90 mL/min (ref >90)
Glucose, Bld: 120 mg/dL — ABNORMAL HIGH (ref 70–99)
Potassium: 2.4 mEq/L — CL (ref 3.5–5.1)
Sodium: 135 mEq/L (ref 135–145)

## 2013-03-04 LAB — BASIC METABOLIC PANEL
BUN: 19 mg/dL (ref 6–23)
GFR calc Af Amer: >90 mL/min (ref >90)
GFR calc non Af Amer: >90 mL/min (ref >90)
Glucose, Bld: 157 mg/dL — ABNORMAL HIGH (ref 70–99)
Potassium: 3.4 mEq/L — ABNORMAL LOW (ref 3.5–5.1)
Sodium: 136 mEq/L (ref 135–145)

## 2013-03-04 LAB — CBC
Hemoglobin: 11.4 g/dL — ABNORMAL LOW (ref 13.0–17.0)
MCH: 27.4 pg (ref 26.0–34.0)
Platelets: 251 10*3/uL (ref 150–400)
RBC: 4.16 MIL/uL — ABNORMAL LOW (ref 4.22–5.81)

## 2013-03-06 LAB — CBC
HCT: 38.4 % — ABNORMAL LOW (ref 39.0–52.0)
Hemoglobin: 12.2 g/dL — ABNORMAL LOW (ref 13.0–17.0)
MCV: 86.1 fL (ref 78.0–100.0)
RBC: 4.46 MIL/uL (ref 4.22–5.81)
WBC: 18.7 10*3/uL — ABNORMAL HIGH (ref 4.0–10.5)

## 2013-03-06 LAB — BASIC METABOLIC PANEL
BUN: 17 mg/dL (ref 6–23)
CO2: 25 mEq/L (ref 19–32)
Chloride: 96 mEq/L (ref 96–112)
Creatinine, Ser: 0.65 mg/dL (ref 0.50–1.35)
Glucose, Bld: 174 mg/dL — ABNORMAL HIGH (ref 70–99)

## 2013-03-08 LAB — PROCALCITONIN: Procalcitonin: 0.23 ng/mL

## 2013-03-08 LAB — BASIC METABOLIC PANEL
BUN: 28 mg/dL — ABNORMAL HIGH (ref 6–23)
Calcium: 9.4 mg/dL (ref 8.4–10.5)
Creatinine, Ser: 0.7 mg/dL (ref 0.50–1.35)
GFR calc Af Amer: >90 mL/min (ref >90)
GFR calc non Af Amer: >90 mL/min (ref >90)

## 2013-03-08 LAB — CBC
HCT: 36.5 % — ABNORMAL LOW (ref 39.0–52.0)
MCH: 27.4 pg (ref 26.0–34.0)
MCHC: 32.1 g/dL (ref 30.0–36.0)
MCV: 85.5 fL (ref 78.0–100.0)
Platelets: 256 10*3/uL (ref 150–400)
RDW: 17.1 % — ABNORMAL HIGH (ref 11.5–15.5)

## 2013-03-09 LAB — BASIC METABOLIC PANEL
BUN: 30 mg/dL — ABNORMAL HIGH (ref 6–23)
Chloride: 95 mEq/L — ABNORMAL LOW (ref 96–112)
Creatinine, Ser: 0.66 mg/dL (ref 0.50–1.35)
GFR calc Af Amer: >90 mL/min (ref >90)
GFR calc non Af Amer: >90 mL/min (ref >90)

## 2013-04-23 ENCOUNTER — Encounter (HOSPITAL_COMMUNITY): Payer: Self-pay | Admitting: Emergency Medicine

## 2013-04-23 ENCOUNTER — Inpatient Hospital Stay (HOSPITAL_COMMUNITY)
Admission: EM | Admit: 2013-04-23 | Discharge: 2013-04-23 | DRG: 205 | Disposition: A | Discharge status: Other Acute Inpatient Hospital | Payer: 59 | Attending: Internal Medicine | Admitting: Internal Medicine

## 2013-04-23 ENCOUNTER — Emergency Department (HOSPITAL_COMMUNITY): Payer: 59

## 2013-04-23 DIAGNOSIS — L89109 Pressure ulcer of unspecified part of back, unspecified stage: Secondary | ICD-10-CM | POA: Diagnosis present

## 2013-04-23 DIAGNOSIS — R64 Cachexia: Secondary | ICD-10-CM | POA: Diagnosis present

## 2013-04-23 DIAGNOSIS — N390 Urinary tract infection, site not specified: Secondary | ICD-10-CM

## 2013-04-23 DIAGNOSIS — Z936 Other artificial openings of urinary tract status: Secondary | ICD-10-CM | POA: Diagnosis not present

## 2013-04-23 DIAGNOSIS — L8993 Pressure ulcer of unspecified site, stage 3: Secondary | ICD-10-CM | POA: Diagnosis present

## 2013-04-23 DIAGNOSIS — D72829 Elevated white blood cell count, unspecified: Secondary | ICD-10-CM | POA: Diagnosis present

## 2013-04-23 DIAGNOSIS — R5381 Other malaise: Secondary | ICD-10-CM | POA: Diagnosis present

## 2013-04-23 DIAGNOSIS — G8250 Quadriplegia, unspecified: Secondary | ICD-10-CM | POA: Diagnosis present

## 2013-04-23 DIAGNOSIS — J9600 Acute respiratory failure, unspecified whether with hypoxia or hypercapnia: Secondary | ICD-10-CM | POA: Diagnosis present

## 2013-04-23 DIAGNOSIS — IMO0002 Reserved for concepts with insufficient information to code with codable children: Secondary | ICD-10-CM | POA: Diagnosis not present

## 2013-04-23 DIAGNOSIS — Y833 Surgical operation with formation of external stoma as the cause of abnormal reaction of the patient, or of later complication, without mention of misadventure at the time of the procedure: Secondary | ICD-10-CM | POA: Diagnosis present

## 2013-04-23 DIAGNOSIS — J690 Pneumonitis due to inhalation of food and vomit: Secondary | ICD-10-CM | POA: Diagnosis present

## 2013-04-23 DIAGNOSIS — Z7401 Bed confinement status: Secondary | ICD-10-CM

## 2013-04-23 DIAGNOSIS — B3749 Other urogenital candidiasis: Secondary | ICD-10-CM | POA: Diagnosis present

## 2013-04-23 DIAGNOSIS — Z888 Allergy status to other drugs, medicaments and biological substances status: Secondary | ICD-10-CM | POA: Diagnosis not present

## 2013-04-23 DIAGNOSIS — J9509 Other tracheostomy complication: Secondary | ICD-10-CM | POA: Diagnosis present

## 2013-04-23 DIAGNOSIS — Z931 Gastrostomy status: Secondary | ICD-10-CM

## 2013-04-23 DIAGNOSIS — Z8673 Personal history of transient ischemic attack (TIA), and cerebral infarction without residual deficits: Secondary | ICD-10-CM

## 2013-04-23 DIAGNOSIS — I469 Cardiac arrest, cause unspecified: Secondary | ICD-10-CM | POA: Diagnosis present

## 2013-04-23 DIAGNOSIS — Z91013 Allergy to seafood: Secondary | ICD-10-CM | POA: Diagnosis not present

## 2013-04-23 DIAGNOSIS — Y9210 Unspecified residential institution as the place of occurrence of the external cause: Secondary | ICD-10-CM | POA: Diagnosis present

## 2013-04-23 DIAGNOSIS — E119 Type 2 diabetes mellitus without complications: Secondary | ICD-10-CM | POA: Diagnosis present

## 2013-04-23 DIAGNOSIS — I4891 Unspecified atrial fibrillation: Secondary | ICD-10-CM | POA: Diagnosis present

## 2013-04-23 DIAGNOSIS — J189 Pneumonia, unspecified organism: Secondary | ICD-10-CM | POA: Diagnosis present

## 2013-04-23 DIAGNOSIS — D649 Anemia, unspecified: Secondary | ICD-10-CM

## 2013-04-23 DIAGNOSIS — Z933 Colostomy status: Secondary | ICD-10-CM | POA: Diagnosis not present

## 2013-04-23 DIAGNOSIS — Z930 Tracheostomy status: Secondary | ICD-10-CM

## 2013-04-23 DIAGNOSIS — A419 Sepsis, unspecified organism: Secondary | ICD-10-CM

## 2013-04-23 DIAGNOSIS — L8994 Pressure ulcer of unspecified site, stage 4: Secondary | ICD-10-CM | POA: Diagnosis present

## 2013-04-23 DIAGNOSIS — D638 Anemia in other chronic diseases classified elsewhere: Secondary | ICD-10-CM | POA: Diagnosis present

## 2013-04-23 LAB — CBC WITH DIFFERENTIAL/PLATELET
BASOS ABS: 0 10*3/uL (ref 0.0–0.1)
BASOS PCT: 0 % (ref 0–1)
EOS PCT: 1 % (ref 0–5)
Eosinophils Absolute: 0.1 10*3/uL (ref 0.0–0.7)
HCT: 29.8 % — ABNORMAL LOW (ref 39.0–52.0)
HEMOGLOBIN: 9.2 g/dL — AB (ref 13.0–17.0)
LYMPHS ABS: 3.2 10*3/uL (ref 0.7–4.0)
LYMPHS PCT: 23 % (ref 12–46)
MCH: 27 pg (ref 26.0–34.0)
MCHC: 30.9 g/dL (ref 30.0–36.0)
MCV: 87.4 fL (ref 78.0–100.0)
Monocytes Absolute: 1.4 10*3/uL — ABNORMAL HIGH (ref 0.1–1.0)
Monocytes Relative: 10 % (ref 3–12)
NEUTROS ABS: 9.3 10*3/uL — AB (ref 1.7–7.7)
Neutrophils Relative %: 66 % (ref 43–77)
Platelets: 361 10*3/uL (ref 150–400)
RBC: 3.41 MIL/uL — AB (ref 4.22–5.81)
RDW: 23.9 % — ABNORMAL HIGH (ref 11.5–15.5)
WBC: 14 10*3/uL — AB (ref 4.0–10.5)

## 2013-04-23 LAB — PROTIME-INR
INR: 1.09 (ref 0.00–1.49)
Prothrombin Time: 13.9 seconds (ref 11.6–15.2)

## 2013-04-23 LAB — COMPREHENSIVE METABOLIC PANEL
ALK PHOS: 122 U/L — AB (ref 39–117)
ALT: 11 U/L (ref 0–53)
AST: 19 U/L (ref 0–37)
Albumin: 1.9 g/dL — ABNORMAL LOW (ref 3.5–5.2)
BILIRUBIN TOTAL: 0.3 mg/dL (ref 0.3–1.2)
BUN: 25 mg/dL — AB (ref 6–23)
CHLORIDE: 102 meq/L (ref 96–112)
CO2: 27 mEq/L (ref 19–32)
Calcium: 8.6 mg/dL (ref 8.4–10.5)
Creatinine, Ser: 0.83 mg/dL (ref 0.50–1.35)
GFR calc Af Amer: >90 mL/min (ref >90)
GFR calc non Af Amer: >90 mL/min (ref >90)
GLUCOSE: 113 mg/dL — AB (ref 70–99)
Potassium: 3.8 mEq/L (ref 3.7–5.3)
Sodium: 141 mEq/L (ref 137–147)
Total Protein: 8.5 g/dL — ABNORMAL HIGH (ref 6.0–8.3)

## 2013-04-23 LAB — URINALYSIS, ROUTINE W REFLEX MICROSCOPIC
Bilirubin Urine: NEGATIVE
Bilirubin Urine: NEGATIVE
GLUCOSE, UA: NEGATIVE mg/dL
Glucose, UA: NEGATIVE mg/dL
Ketones, ur: NEGATIVE mg/dL
Ketones, ur: NEGATIVE mg/dL
Nitrite: NEGATIVE
Nitrite: NEGATIVE
Protein, ur: 100 mg/dL — AB
Protein, ur: 30 mg/dL — AB
Specific Gravity, Urine: 1.013 (ref 1.005–1.030)
Specific Gravity, Urine: 1.016 (ref 1.005–1.030)
UROBILINOGEN UA: 0.2 mg/dL (ref 0.0–1.0)
Urobilinogen, UA: 0.2 mg/dL (ref 0.0–1.0)
pH: 7 (ref 5.0–8.0)
pH: 7 (ref 5.0–8.0)

## 2013-04-23 LAB — POCT I-STAT TROPONIN I: Troponin i, poc: 0.01 ng/mL (ref 0.00–0.08)

## 2013-04-23 LAB — URINE MICROSCOPIC-ADD ON

## 2013-04-23 LAB — TROPONIN I

## 2013-04-23 LAB — APTT: APTT: 32 s (ref 24–37)

## 2013-04-23 LAB — CG4 I-STAT (LACTIC ACID): Lactic Acid, Venous: 2.99 mmol/L — ABNORMAL HIGH (ref 0.5–2.2)

## 2013-04-23 MED ORDER — FLUCONAZOLE IN SODIUM CHLORIDE 200-0.9 MG/100ML-% IV SOLN
200.0000 mg | INTRAVENOUS | Status: DC
  Filled 2013-04-23: qty 100

## 2013-04-23 MED ORDER — DEXTROSE 5 % IV SOLN
1.0000 g | Freq: Once | INTRAVENOUS | Status: DC
  Filled 2013-04-23: qty 1

## 2013-04-23 MED ORDER — VANCOMYCIN HCL IN DEXTROSE 1-5 GM/200ML-% IV SOLN
1000.0000 mg | Freq: Once | INTRAVENOUS | Status: AC
  Administered 2013-04-23: 1000 mg via INTRAVENOUS
  Filled 2013-04-23: qty 200

## 2013-04-23 MED ORDER — SODIUM CHLORIDE 0.9 % IV BOLUS (SEPSIS): 1000.0000 mL | Freq: Once | INTRAVENOUS | Status: DC

## 2013-04-23 MED ORDER — SODIUM CHLORIDE 0.9 % IV SOLN
INTRAVENOUS | Status: DC
  Administered 2013-04-23: 20:00:00 via INTRAVENOUS
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
## 2013-04-23 NOTE — ED Notes (Signed)
Pt already in gown, placed on monitor, continuous pulse oximetry and blood pressure cuff 

## 2013-04-23 NOTE — Discharge Instructions (Signed)
Anemia, Nonspecific Anemia is a condition in which the concentration of red blood cells or hemoglobin in the blood is below normal. Hemoglobin is a substance in red blood cells that carries oxygen to the tissues of the body. Anemia results in not enough oxygen reaching these tissues.  CAUSES  Common causes of anemia include:   Excessive bleeding. Bleeding may be internal or external. This includes excessive bleeding from periods (in women) or from the intestine.   Poor nutrition.   Chronic kidney, thyroid, and liver disease.  Bone marrow disorders that decrease red blood cell production.  Cancer and treatments for cancer.  HIV, AIDS, and their treatments.  Spleen problems that increase red blood cell destruction.  Blood disorders.  Excess destruction of red blood cells due to infection, medicines, and autoimmune disorders. SIGNS AND SYMPTOMS   Minor weakness.   Dizziness.   Headache.  Palpitations.   Shortness of breath, especially with exercise.   Paleness.  Cold sensitivity.  Indigestion.  Nausea.  Difficulty sleeping.  Difficulty concentrating. Symptoms may occur suddenly or they may develop slowly.  DIAGNOSIS  Additional blood tests are often needed. These help your health care provider determine the best treatment. Your health care provider will check your stool for blood and look for other causes of blood loss.  TREATMENT  Treatment varies depending on the cause of the anemia. Treatment can include:   Supplements of iron, vitamin B12, or folic acid.   Hormone medicines.   A blood transfusion. This may be needed if blood loss is severe.   Hospitalization. This may be needed if there is significant continual blood loss.   Dietary changes.  Spleen removal. HOME CARE INSTRUCTIONS Keep all follow-up appointments. It often takes many weeks to correct anemia, and having your health care provider check on your condition and your response to  treatment is very important. SEEK IMMEDIATE MEDICAL CARE IF:   You develop extreme weakness, shortness of breath, or chest pain.   You become dizzy or have trouble concentrating.  You develop heavy vaginal bleeding.   You develop a rash.   You have bloody or black, tarry stools.   You faint.   You vomit up blood.   You vomit repeatedly.   You have abdominal pain.  You have a fever or persistent symptoms for more than 2 3 days.   You have a fever and your symptoms suddenly get worse.   You are dehydrated.  MAKE SURE YOU:  Understand these instructions.  Will watch your condition.  Will get help right away if you are not doing well or get worse. Document Released: 04/19/2004 Document Revised: 11/12/2012 Document Reviewed: 09/05/2012 [employer] Patient Information 2014 [place].  CPR, Adult Cardiopulmonary resuscitation(CPR) is a skill that almost anyone can learn to perform. It can be lifesaving when performed on a person whose breathing or heartbeat has stopped. When the heart stops beating, the flow of blood to the brain and other vital organs also stops. This usually causes brain damage or death if not treated within minutes.  CPR consists of steps that are based on the CAB sequence: chest compressions, airway, and breathing.  If you see an adult collapse and lose consciousness or if you encounter an adult who appears to be unconscious, do the following: 1. Ensure scene safety. Quickly look at the area where the ill or injured person is located. Go to help the person only if the location (car, street, room, hillside) appears to be safe to enter.  2. Check for response. Shake the person or tap a shoulder and ask, "Are you all right?"  If the person responds and is breathing normally but is ill or injured, call your local emergency services (911 in [place] and wait for help. While waiting, recheck the person frequently.  If there is no response and the person  is not breathing or is only gasping, continue to step 3. 3. Shout or call for help.  If you are by yourself, shout for help. If someone responds, tell that person to call local emergency services (911 in [place] Also tell the person to get an automated external defibrillator (AED) if this equipment is nearby and accessible. If no one responds, call emergency services yourself.  If there are two rescuers, one rescuer should prepare to begin CPR, and the second rescuer should call emergency services. The second rescuer should also get an AED if one is available. The AED provides an electric shock to the heart if necessary, which can restart the heart. AED devices are found in many airports, gyms, large buildings, sports facilities, and public transportation vehicles. After calling emergency services and possibly getting an AED, the second rescuer should return as soon as possible and use the AED (if available) or assist with CPR. 4. Position the person. If possible, the ill or injured person should be on a hard surface and should be facing up. You may need to roll the person into this position. 5. Perform chest compressions. Kneel next to the person's chest. Place the heel of one of your hands over the lower half of the person's breastbone (sternum) at the middle of the chest. The heel of your other hand should be placed on top of the first hand so that the hands overlap and are somewhat parallel. To perform a compression, depress the chest at least 2 inches (5 cm). Then allow the chest to return completely to its normal position before the next compression. Compressions should be done very fast, at a rate of at least 100 per minute. Count the compressions while doing them. This will help you maintain the proper compression rate and identify when it is time to give rescue breaths.  If there are two rescuers, one rescuer should perform chest compressions while the second rescuer opens the airway and gives  rescue breaths as described in steps 6 and 7. 6. Open the airway. If possible, chest compressions should be combined with opening the airway and giving rescue breaths. If rescuers are untrained in providing breaths or are unable to give breaths, they should just perform chest compressions. If trained and able, rescuers should open the airway and give 2 rescue breaths after every 30 chest compressions. To open an adult's airway:  Place one hand on the person's forehead and push with your palm to tilt the head back.  Place the fingers of your other hand under the bony part of the lower jaw (not the soft part under the chin) to gently bring the chin forward. Remember "Head tilt, chin lift." If the person has been in a traumatic accident, be careful when moving the head and neck. 7. Give rescue breaths (if trained and able). If trained and able, after opening the airway, you should give 2 rescue breaths. To do this, kneel next to the person's head. While kneeling, bend forward to give 2 mouth-to-mouth breaths. Give the breaths while pinching the person's nose closed and holding the airway open. Each breath should take about 1 second. Make sure  the chest rises when you give a breath. If the chest does not rise, reposition the head and try a breath again. 8. Perform defibrillation. Any time that an AED is available and the person is unresponsive, not breathing, or only gasping, you should use the AED as soon as possible. Follow the directions on the device showing where to attach the external pads. Before delivering a shock, make sure no one is in contact with the person. The AED will automatically determine if a shock is necessary. If so, it will then deliver one shock. Once one shock is delivered, you should perform 2 minutes of CPR (about 5 cycles) before delivering another shock.  If there are two rescuers, one rescuer should continue CPR while the second rescuer prepares to use the AED. 9. Continue CPR and  defibrillation. If the person is still unresponsive, not breathing, or only gasping, you must continue CPR, starting with compressions. Continue with 2 minutes of compressions and breaths (5 cycles) followed by defibrillation (1 shock) until the person starts breathing normally or until ambulance personnel take over.  If there are two rescuers, the rescuers should change roles every 2 minutes to maintain the quality and rate of chest compressions. The switch should take place as quickly as possible (in less than 5 seconds). CPR is intense and strenuous for the rescuer, but it can be lifesaving. Rescuers can become tired fairly quickly. However, they need to keep in mind that they have a better chance of saving a life if they attempt CPR instead of doing nothing and waiting for ambulance personnel to arrive. After ambulance personnel arrive, make sure you remain on the scene to give trained rescuers a report of what happened. This is an important part of the overall care provided to the sick or injured person. Almost all communities have programs for training the public in the skills of CPR. Knowing how to perform CPR can be crucial if your help is needed in an emergency. Document Released: 01/07/2007 Document Revised: 07/07/2012 Document Reviewed: 05/05/2012 [facility] Patient Information 2014 [place], [place].  Pneumonia, Adult Pneumonia is an infection of the lungs.  CAUSES Pneumonia may be caused by bacteria or a virus. Usually, these infections are caused by breathing infectious particles into the lungs (respiratory tract). SYMPTOMS   Cough.  Fever.  Chest pain.  Increased rate of breathing.  Wheezing.  Mucus production. DIAGNOSIS  If you have the common symptoms of pneumonia, your caregiver will typically confirm the diagnosis with a chest X-ray. The X-ray will show an abnormality in the lung (pulmonary infiltrate) if you have pneumonia. Other tests of your blood, urine, or sputum may be  done to find the specific cause of your pneumonia. Your caregiver may also do tests (blood gases or pulse oximetry) to see how well your lungs are working. TREATMENT  Some forms of pneumonia may be spread to other people when you cough or sneeze. You may be asked to wear a mask before and during your exam. Pneumonia that is caused by bacteria is treated with antibiotic medicine. Pneumonia that is caused by the influenza virus may be treated with an antiviral medicine. Most other viral infections must run their course. These infections will not respond to antibiotics.  PREVENTION A pneumococcal shot (vaccine) is available to prevent a common bacterial cause of pneumonia. This is usually suggested for:  People over 14 years old.  Patients on chemotherapy.  People with chronic lung problems, such as bronchitis or emphysema.  People with  immune system problems. If you are over 65 or have a high risk condition, you may receive the pneumococcal vaccine if you have not received it before. In some countries, a routine influenza vaccine is also recommended. This vaccine can help prevent some cases of pneumonia.You may be offered the influenza vaccine as part of your care. If you smoke, it is time to quit. You may receive instructions on how to stop smoking. Your caregiver can provide medicines and counseling to help you quit. HOME CARE INSTRUCTIONS   Cough suppressants may be used if you are losing too much rest. However, coughing protects you by clearing your lungs. You should avoid using cough suppressants if you can.  Your caregiver may have prescribed medicine if he or she thinks your pneumonia is caused by a bacteria or influenza. Finish your medicine even if you start to feel better.  Your caregiver may also prescribe an expectorant. This loosens the mucus to be coughed up.  Only take over-the-counter or prescription medicines for pain, discomfort, or fever as directed by your caregiver.  Do  not smoke. Smoking is a common cause of bronchitis and can contribute to pneumonia. If you are a smoker and continue to smoke, your cough may last several weeks after your pneumonia has cleared.  A cold steam vaporizer or humidifier in your room or home may help loosen mucus.  Coughing is often worse at night. Sleeping in a semi-upright position in a recliner or using a couple pillows under your head will help with this.  Get rest as you feel it is needed. Your body will usually let you know when you need to rest. SEEK IMMEDIATE MEDICAL CARE IF:   Your illness becomes worse. This is especially true if you are elderly or weakened from any other disease.  You cannot control your cough with suppressants and are losing sleep.  You begin coughing up blood.  You develop pain which is getting worse or is uncontrolled with medicines.  You have a fever.  Any of the symptoms which initially brought you in for treatment are getting worse rather than better.  You develop shortness of breath or chest pain. MAKE SURE YOU:   Understand these instructions.  Will watch your condition.  Will get help right away if you are not doing well or get worse. Document Released: 03/12/2005 Document Revised: 06/04/2011 Document Reviewed: 06/01/2010 [facility] Patient Information 2014 [place], [place].  Sepsis, Adult Sepsis is a serious infection of your blood or tissues that affects your whole body. The infection that causes sepsis may be bacterial, viral, fungal, or parasitic. Sepsis may be life threatening. Sepsis can cause your blood pressure to drop. This may result in shock. Shock causes your central nervous system and your organs to stop working correctly.  RISK FACTORS Sepsis can happen in anyone, but it is more likely to happen in people who have weakened immune systems. SIGNS AND SYMPTOMS  Symptoms of sepsis can include:  Fever or low body temperature (hypothermia).  Rapid breathing  (hyperventilation).  Chills.  Rapid heartbeat (tachycardia).  Confusion or lightheadedness.  Trouble breathing.  Urinating much less than usual.  Cool, clammy skin or red, flushed skin.  Other problems with the heart, kidneys, or brain. DIAGNOSIS  Your health care provider will likely do tests to look for an infection, to see if the infection has spread to your blood, and to see how serious your condition is. Tests can include:  Blood tests, including cultures of your blood.  Cultures of other fluids from your body, such as:  Urine.  Pus from wounds.  Mucus coughed up from your lungs.  Urine tests other than cultures.  X-ray exams or other imaging tests. TREATMENT  Treatment will begin with elimination of the source of infection. If your sepsis is likely caused by a bacterial or fungal infection, you will be given antibiotic or antifungal medicines. You may also receive:  Oxygen.  Fluids through an IV tube.  Medicines to increase your blood pressure.  A machine to clean your blood (dialysis) if your kidneys fail.  A machine to help you breathe if your lungs fail. SEEK IMMEDIATE MEDICAL CARE IF: You get an infection or develop any of the signs and symptoms of sepsis after surgery or a hospitalization. Document Released: 12/09/2002 Document Revised: 12/31/2012 Document Reviewed: 11/17/2012 [facility] Patient Information 2014 [place], [place].

## 2013-04-23 NOTE — Consult Note (Signed)
Dr. Petra KubaKilpatrick called me this afternoon because the patient was bleeding [facility]. [name] was placed about 4 months ago. He is quadreplegic.   He is currently comfortable and not bleeding. He is awake and alert and breathing easily through the trach. The trach site is clean and healthy.  Flexible fiberoptic exam was performed through the trach tube. The trachea is clear. There is old blood in both mainstem bronchi. The trach was pulled out about 3 cm to evaluate the trachea adjacent to the end of the tube and all is clear there as well. No ulcerations or bleeding sites were identified.   Stable trach. Source of bleeding not identified. If there are further concerns, then bronchoscopy may be needed (by Pulm/CCM).  Call me for any other trach concerns.
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
## 2013-04-23 NOTE — ED Notes (Signed)
Patient presents to ED via [facility] from [facility]. Staff at [facility] states they were changing his trach and he arrested with CPR for 5 minutes.

## 2013-04-23 NOTE — ED Notes (Signed)
Admitting physician from [facility] to [place] [facility]. [facility] is to be transported back to [facility] per admitting physician Izola PriceMyers.

## 2013-04-24 LAB — URINE CULTURE
COLONY COUNT: NO GROWTH
COLONY COUNT: NO GROWTH
CULTURE: NO GROWTH
CULTURE: NO GROWTH

## 2013-04-24 LAB — GLUCOSE, CAPILLARY: Glucose-Capillary: 224 mg/dL — ABNORMAL HIGH (ref 70–99)

## 2013-04-30 LAB — CULTURE, BLOOD (ROUTINE X 2)
Culture: NO GROWTH
Culture: NO GROWTH

## 2014-04-14 IMAGING — CR DG ABD PORTABLE 1V
1 series · 1 of 1 positions shown · non-contrast
Comparison: 01/06/2013.

CLINICAL DATA: Emesis.

EXAM:
PORTABLE ABDOMEN - 1 VIEW

[AP]
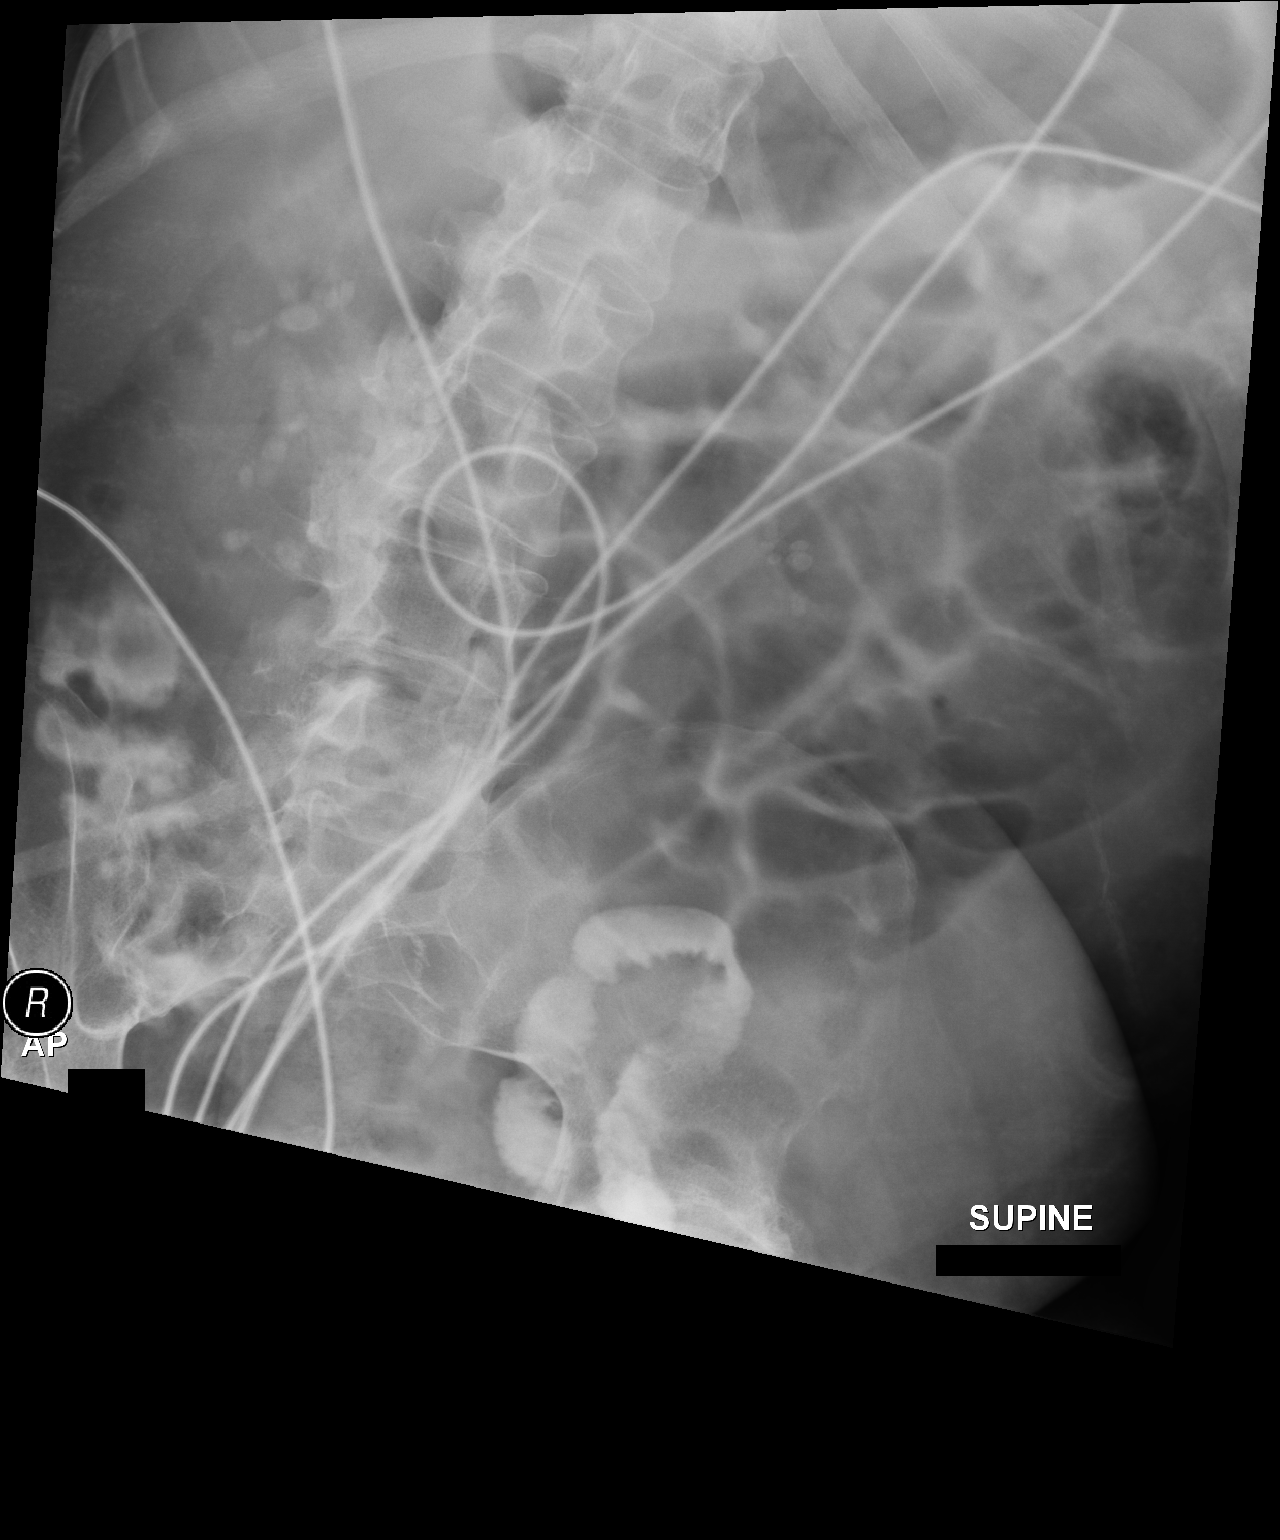

[1 of 1 positions shown; findings below may reference images not displayed]

FINDINGS: Contrast material from yesterday's gastrostomy tube injection is now
seen in the distal small bowel and proximal colon. The distal small
bowel appears very decompressed. However, there are multiple loops
of gas-filled small bowel throughout the epigastric region and left
upper quadrant which are mildly dilated (up to 4.3 cm in diameter).
No definite pneumoperitoneum on this single supine view of the
abdomen. Numerous calcifications are seen along both sides of the
lumbar spine, suspicious for bilateral renal calculi. Alternatively,
some of the calcifications on the right side may represent calcified
gallstones.
IMPRESSION: 1. Bowel gas pattern is nonspecific, but may be concerning for
potential early or partial small bowel obstruction.
2. No definite pneumoperitoneum.
3. Numerous calcifications in the abdomen, as above, concerning for
potential renal calculi.
Above findings could be better evaluated with CT of the abdomen and
pelvis if clinically indicated.

## 2014-04-14 IMAGING — CR DG CHEST 1V PORT
1 series · 1 of 1 positions shown · non-contrast
Comparison: No priors.

CLINICAL DATA: Respiratory failure.

EXAM:
PORTABLE CHEST - 1 VIEW

[AP]
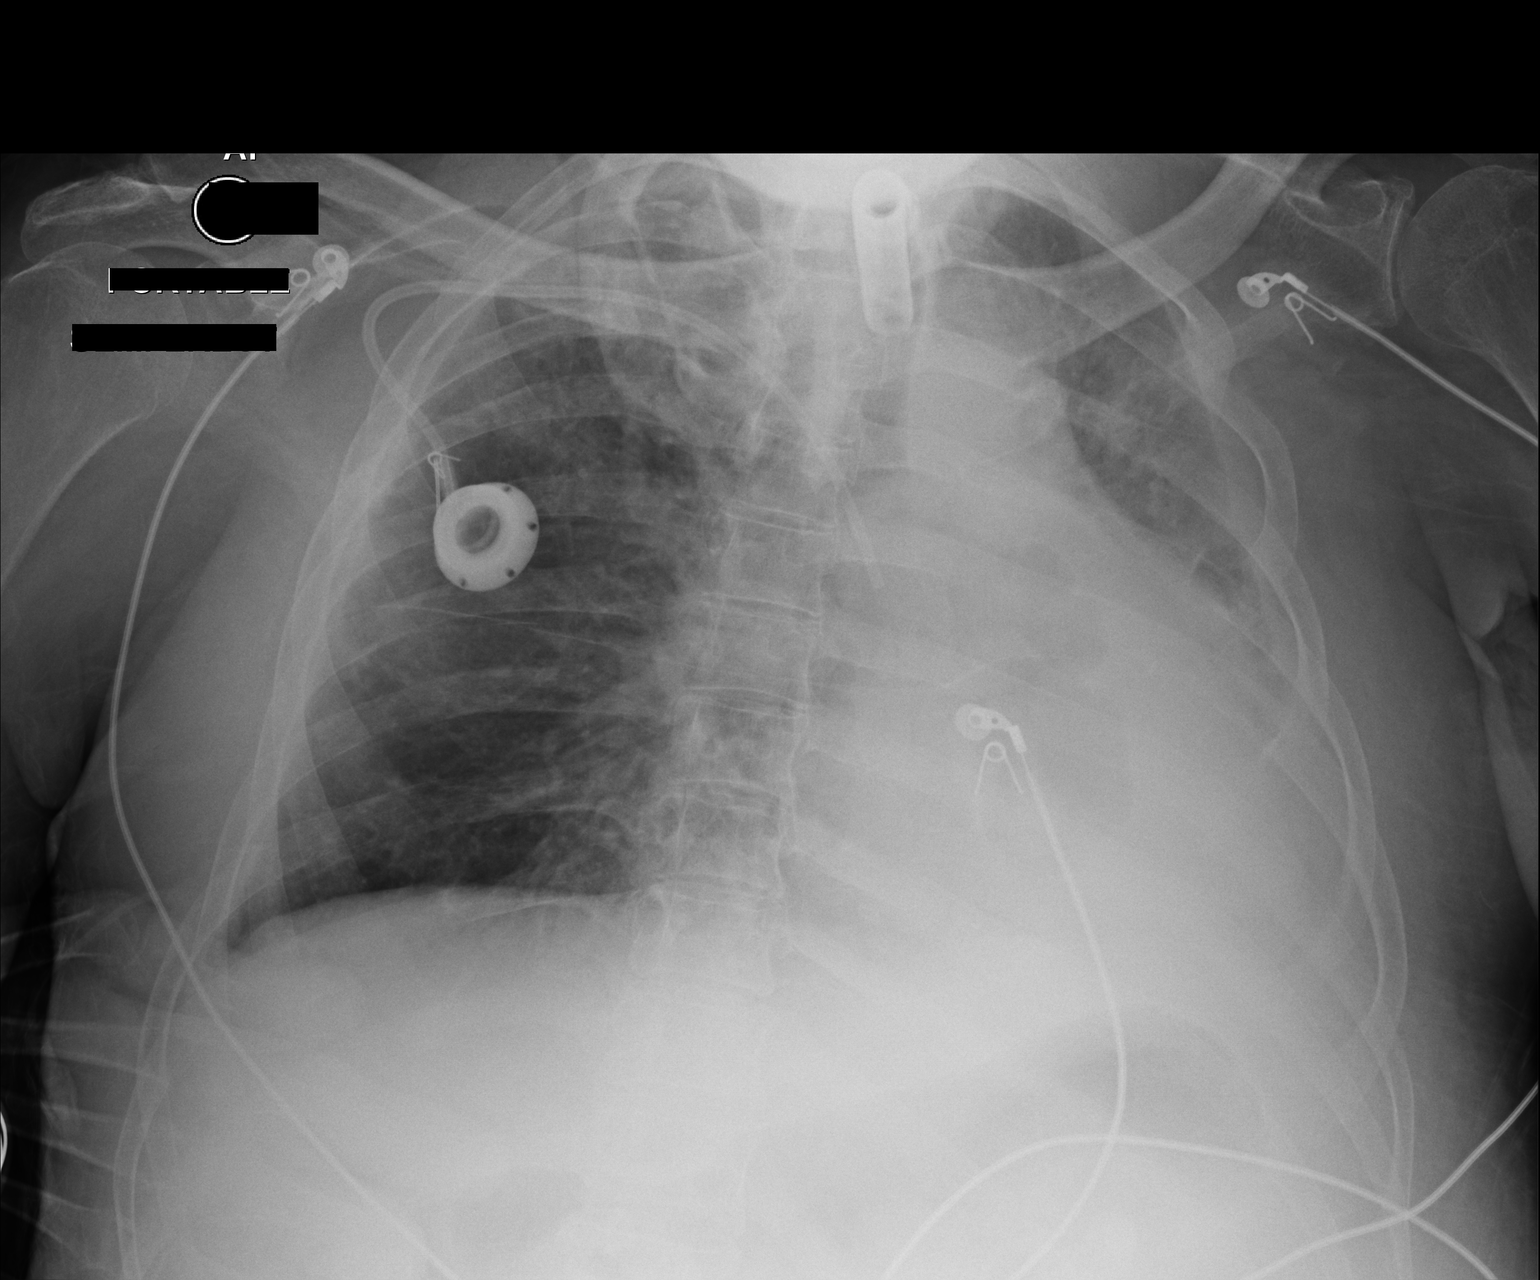

[1 of 1 positions shown; findings below may reference images not displayed]

FINDINGS: Tracheostomy tube in position with tip terminating approximately
cm above the carina. Right subclavian single-lumen porta cath with
tip terminating in the mid to distal superior vena cava. Complete
opacification in the base of the left hemithorax compatible with
atelectasis and or consolidation throughout much of the left lower
lobe and portions of the left upper lobe, with superimposed moderate
to large left pleural effusion. There appears to be some shift of
cardiomediastinal structures to the left, indicative of significant
atelectasis in the left hemithorax. Right lung appears relatively
clear. No evidence of pulmonary edema. Heart size is likely
enlarged, but mediastinal contours are distorted by patient's
rotation to the left.
IMPRESSION: 1. Support apparatus, as above.
2. Extensive atelectasis and or consolidation in the left mid to
lower lung with moderate to large left pleural effusion.

## 2014-05-07 IMAGING — CR DG CHEST 1V PORT
1 series · 1 of 1 positions shown · non-contrast
Comparison: Multiple priors

CLINICAL DATA: Patient with tracheostomy tube.

EXAM:
PORTABLE CHEST - 1 VIEW

[AP]
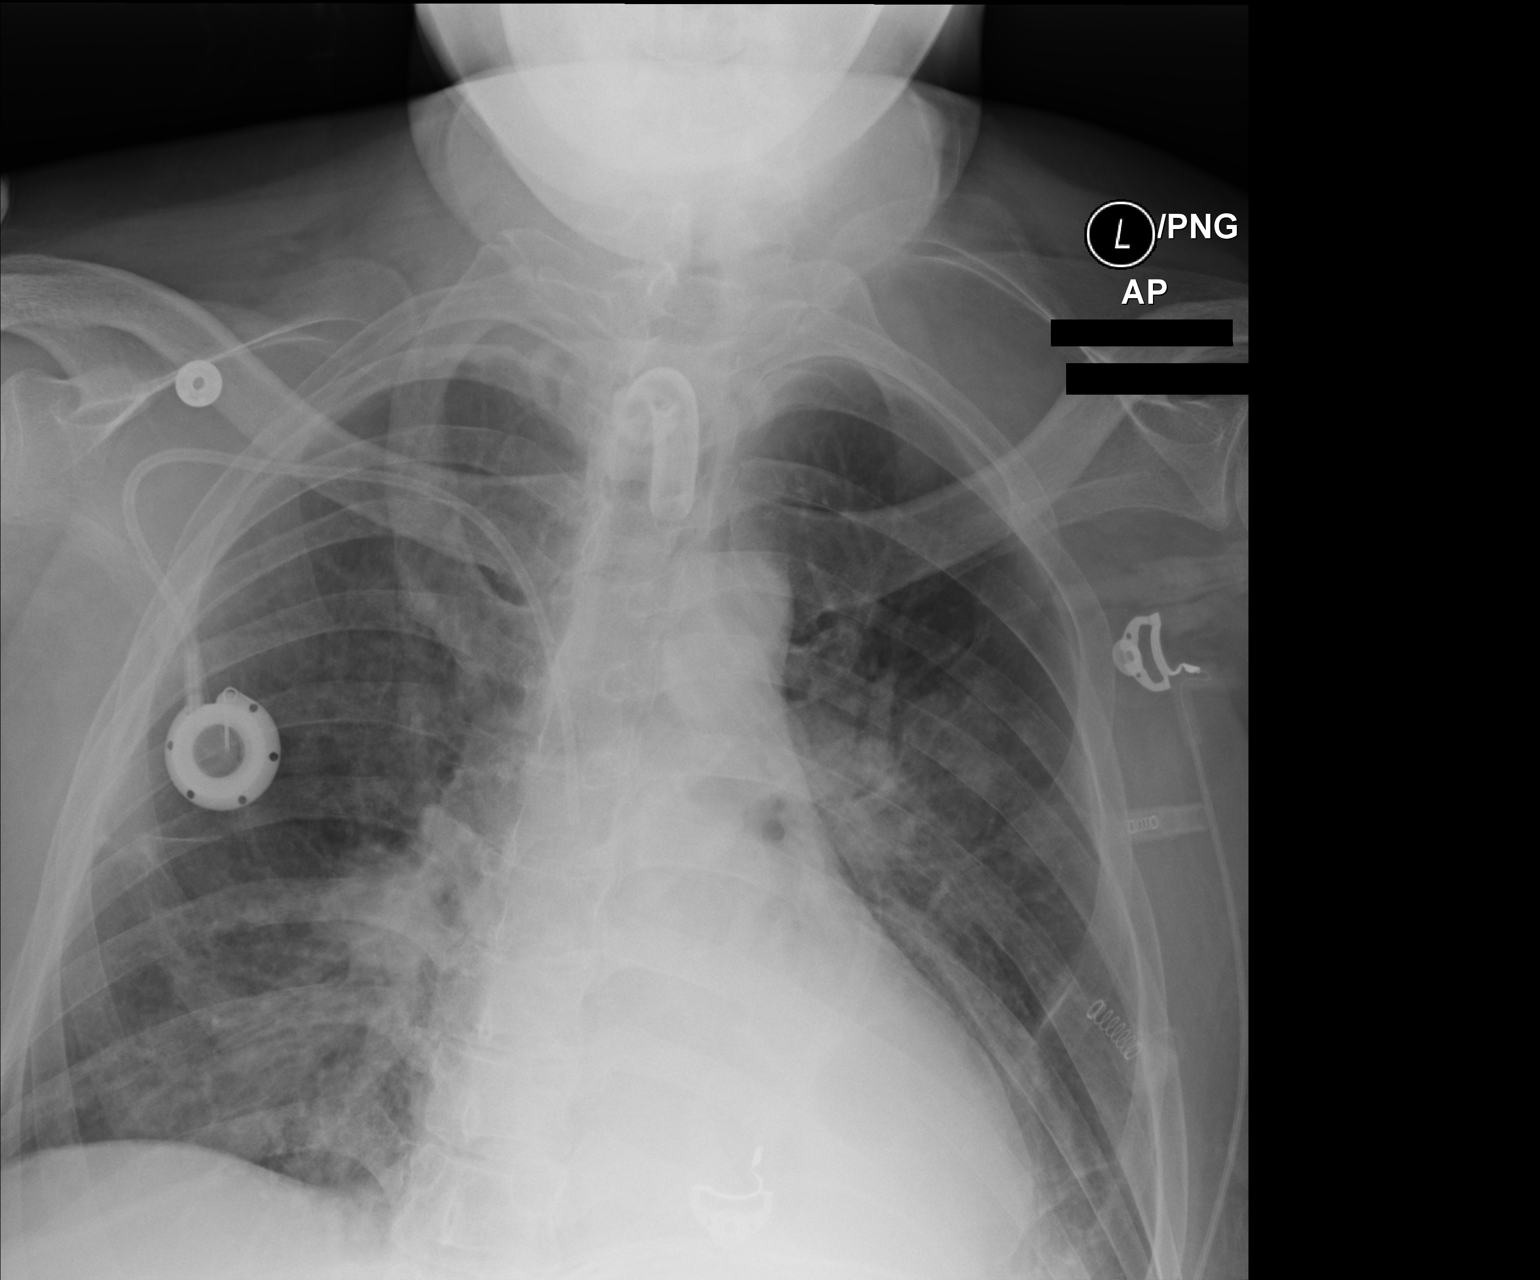

[1 of 1 positions shown; findings below may reference images not displayed]

FINDINGS: Tracheostomy tube and right Port-A-Cath are unchanged in position.
The cardiomediastinal silhouette is unchanged. Persistent bibasilar
opacities, left greater than right. No pleural effusion or
pneumothorax.
IMPRESSION: No change in bibasilar opacities, left greater than right. Findings
likely represent atelectasis with possible underlying infection on
the left.

## 2014-06-01 IMAGING — CR DG CHEST 1V PORT
1 series · 1 of 1 positions shown · non-contrast
Comparison: Chest x-ray 02/16/2013.

CLINICAL DATA: Respiratory failure.

EXAM:
PORTABLE CHEST - 1 VIEW

[AP]
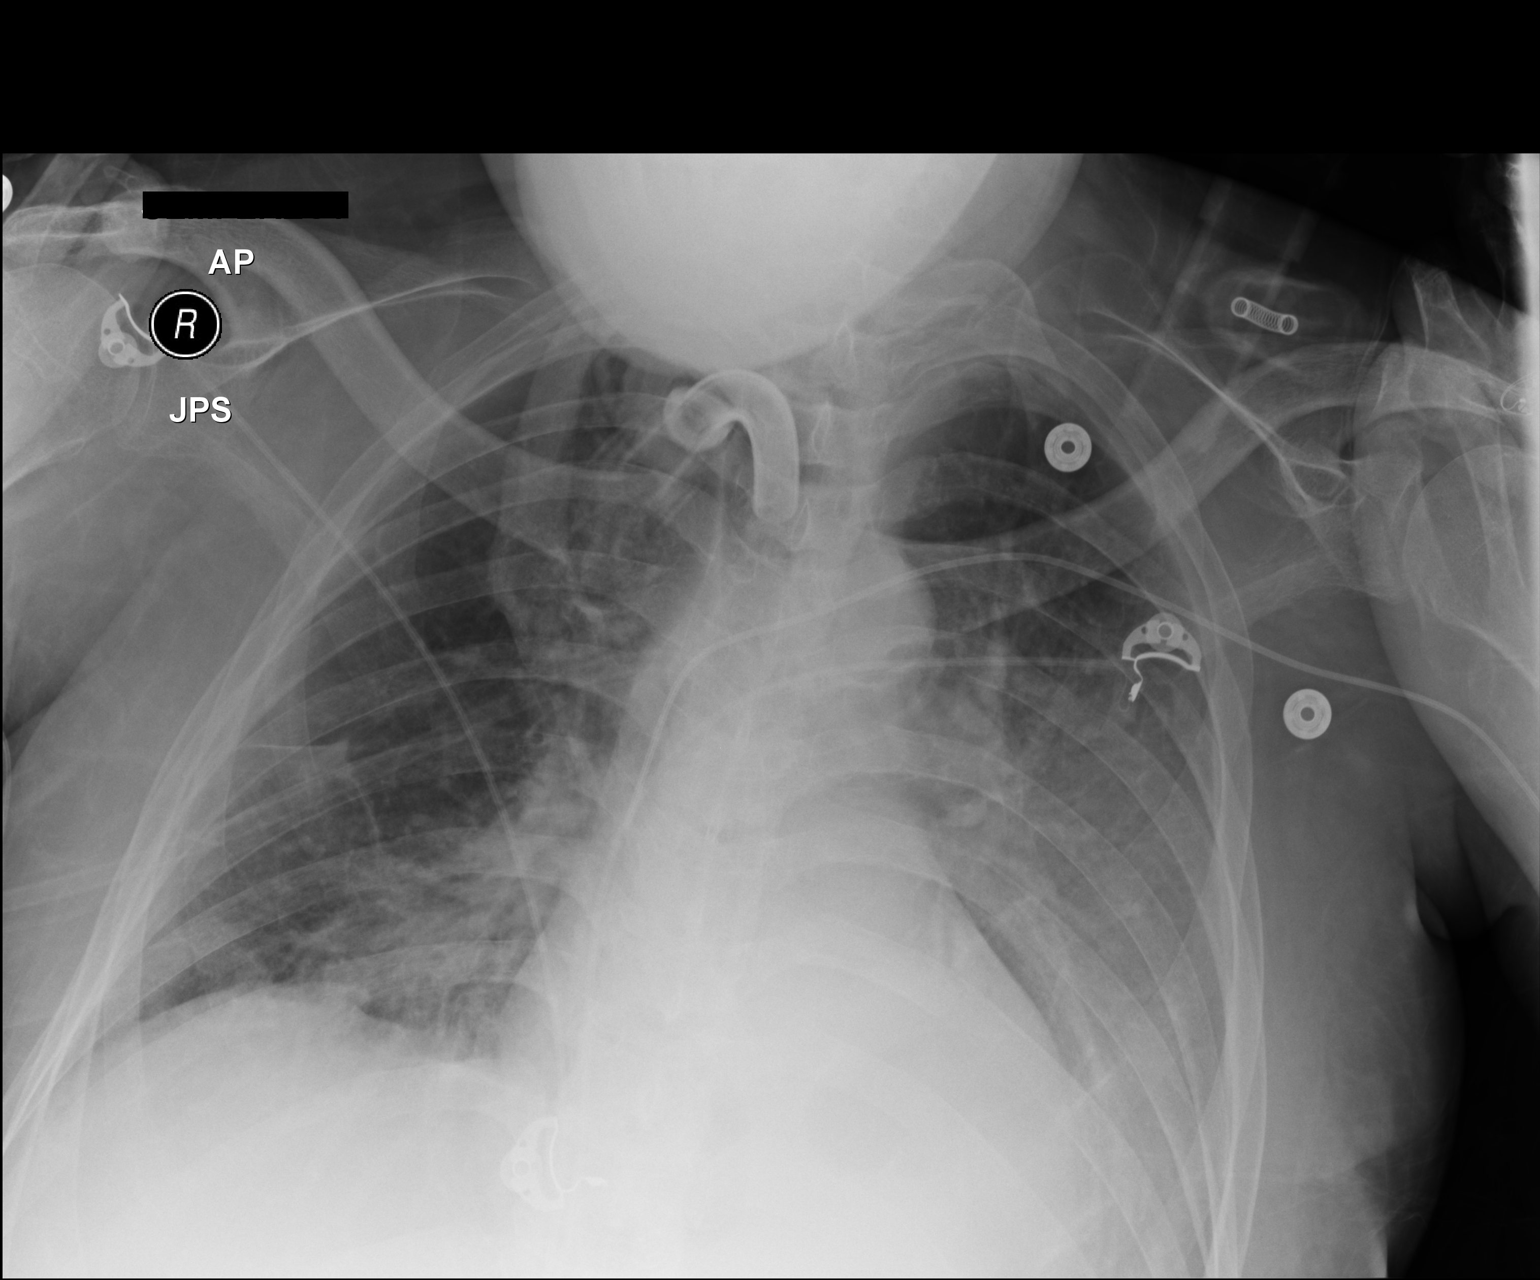

[1 of 1 positions shown; findings below may reference images not displayed]

FINDINGS: Tracheostomy tube in good anatomic position. PICC line in stable
position. Bilateral pulmonary infiltrates are again noted, primarily
in the lung bases. Left pleural effusion present. No pneumothorax.
Cardiomegaly with normal pulmonary vascularity.
IMPRESSION: 1. Persistent bibasilar pulmonary infiltrates. Left pleural effusion
is present.

2. Tracheostomy tube and PICC line in stable position.

## 2014-06-07 IMAGING — CR DG CHEST 1V PORT
1 series · 1 of 1 positions shown · non-contrast
Comparison: 02/24/2013

CLINICAL DATA: Respiratory failure.

EXAM:
PORTABLE CHEST - 1 VIEW

[AP]
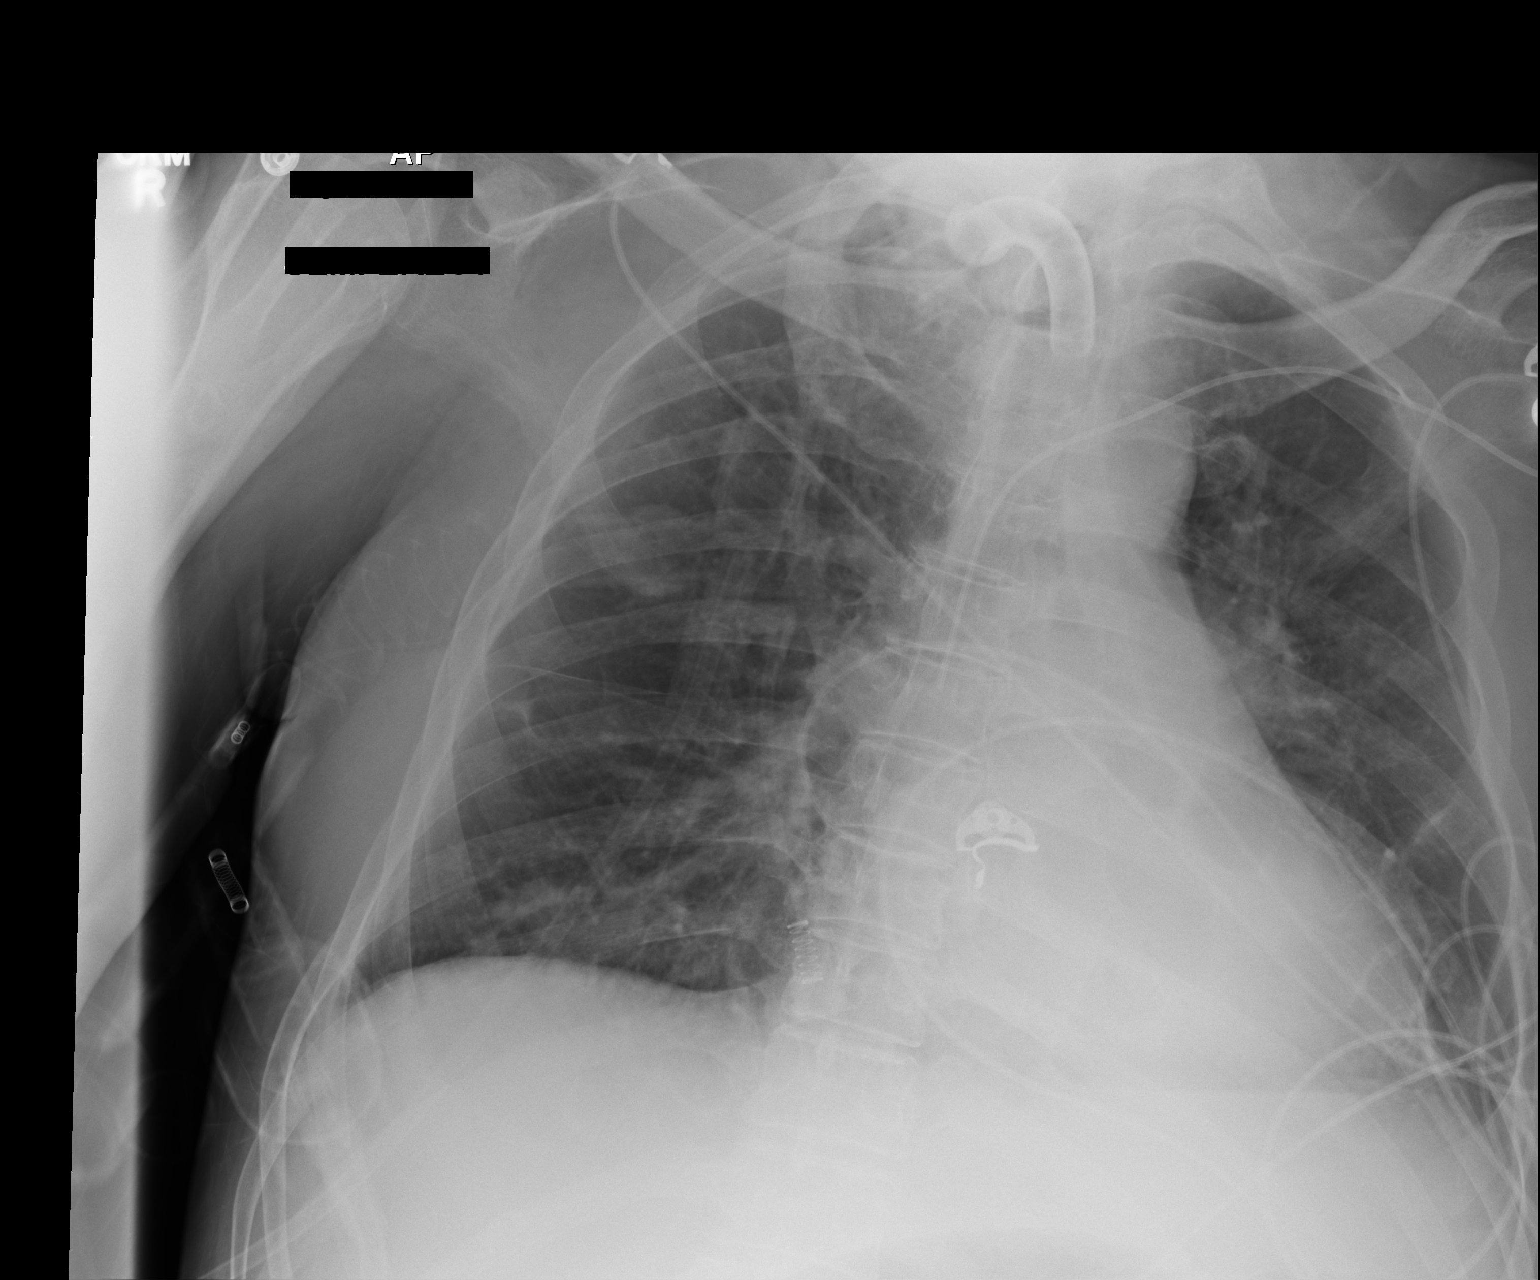

[1 of 1 positions shown; findings below may reference images not displayed]

FINDINGS: Tracheostomy tube overlies airway, unchanged. Left-sided PICC line
remains in place with tip overlying the mid to lower SVC, unchanged.
The cardiomediastinal silhouette is within normal limits. The
current examination is obtained with a greater degree of lung
inflation. Opacities in the right lung base have decreased compared
to the prior study. Retrocardiac opacity remains in the left lung
base, although also improved from the prior study. No definite
pleural effusion or pneumothorax is identified. No acute osseous
abnormality is seen.
IMPRESSION: Greater lung inflation with mild interval improvement of bibasilar
lung opacities.

## 2014-07-13 NOTE — Consult Note (Signed)
Chief Complaint:   Subjective/Chief Complaint followup on sigmoid volvulus.  doing well.  denies abdominal pain or nausea, no vomiting. Patient states he is passing flatus.   VITAL SIGNS/ANCILLARY NOTES: **Vital Signs.:   14-Aug-13 13:16   Vital Signs Type Routine   Temperature Temperature (F) 98.1   Celsius 36.7   Temperature Source Oral   Pulse Pulse 78   Respirations Respirations 18   Systolic BP Systolic BP 91   Diastolic BP (mmHg) Diastolic BP (mmHg) 59   Mean BP 69   Pulse Ox % Pulse Ox % 95   Pulse Ox Activity Level  At rest   Oxygen Delivery Room Air/ 21 %   Brief Assessment:   Cardiac Regular    Respiratory clear BS    Gastrointestinal details normal moderate distension but softer, no abdominal pain, bs positive, distant but not high pitch   Lab Results: Routine Chem:  14-Aug-13 04:17    Glucose, Serum 80   BUN  25   Creatinine (comp) 1.22   Sodium, Serum 141   Potassium, Serum  3.4   Chloride, Serum  112   CO2, Serum 22   Calcium (Total), Serum 8.6   Anion Gap 7   Osmolality (calc) 285   eGFR (African American) >60   eGFR (Non-African American) >60 (eGFR values <54m/min/1.73 m2 may be an indication of chronic kidney disease (CKD). Calculated eGFR is useful in patients with stable renal function. The eGFR calculation will not be reliable in acutely ill patients when serum creatinine is changing rapidly. It is not useful in  patients on dialysis. The eGFR calculation may not be applicable to patients at the low and high extremes of body sizes, pregnant women, and vegetarians.)   Magnesium, Serum  1.7 (1.8-2.4 THERAPEUTIC RANGE: 4-7 mg/dL TOXIC: > 10 mg/dL  -----------------------)  Routine Hem:  14-Aug-13 04:17    WBC (CBC)  11.1   RBC (CBC) 4.44   Hemoglobin (CBC) 14.8   Hematocrit (CBC) 44.3   Platelet Count (CBC) 219   MCV 100   MCH 33.4   MCHC 33.5   RDW  16.1   Neutrophil % 49.8   Lymphocyte % 36.0   Monocyte % 11.6   Eosinophil % 2.1    Basophil % 0.5   Neutrophil # 5.5   Lymphocyte #  4.0   Monocyte #  1.3   Eosinophil # 0.2   Basophil # 0.1 (Result(s) reported on 07 Nov 2011 at 05:00AM.)   Assessment/Plan:  Assessment/Plan:   Assessment 1) sigmoid volvulus- abdomen still protuberant but much softer after colon decompression last night.  abd films this evening still with colonic distension, but moreso c/w baseline ogilvies syndrome. On clears.    Plan 1) continue current.  discussed with Dr BMarina Gravel  agree with planned surgery as clinically feasible.  Dr  EVira Agarto follow starting tomorrow.   Electronic Signatures: SLoistine Simas(MD)  (Signed 14-Aug-13 19:39)  Authored: Chief Complaint, VITAL SIGNS/ANCILLARY NOTES, Brief Assessment, Lab Results, Assessment/Plan   Last Updated: 14-Aug-13 19:39 by SLoistine Simas(MD)
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
## 2014-07-13 NOTE — Consult Note (Signed)
Brief Consult Note: Diagnosis: abdominal pain/distension/sigmoid volvulus.   Patient was seen by consultant.   Consult note dictated.   Recommend to proceed with surgery or procedure.   Orders entered.   Discussed with Attending MD.   Comments: Please see full GI consult#[id number].  Patient presenting with abdominal distension in the setting previous h/o colonic pseudoobstruction.  Patient with CT c/w sigmoid volvulus.  Colon diameter about 13 cm, concern for spontaneous perforation. Recommend colonoscopic decompression this evening.  I have discuessed the risks beneifts and complications of this proceedure to include not limited to bleeding infection perforation and he wishes to proceed.  Electronic Signatures: Barnetta ChapelSkulskie, Martin (MD)  (Signed 13-Aug-13 19:41)  Authored: Brief Consult Note   Last Updated: 13-Aug-13 19:41 by Barnetta ChapelSkulskie, Martin (MD)
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
## 2014-07-13 NOTE — Consult Note (Signed)
Chief Complaint:   Subjective/Chief Complaint Please see full colonoscopy proceedure note.  Sigmoid volvulus between about 25-30 cm, easily opened to a broadly distended colonic lumen above.  Discussed with Dr Excell Seltzerooper, recommend consideration of definative proceedure.   Electronic Signatures: Barnetta ChapelSkulskie, Doneshia Hill (MD)  (Signed 14-Aug-13 00:55)  Authored: Chief Complaint   Last Updated: 14-Aug-13 00:55 by Barnetta ChapelSkulskie, Crestina Strike (MD)

## 2014-07-13 NOTE — Consult Note (Signed)
Brief Consult Note: Diagnosis: colonic distension.   Patient was seen by consultant.   Consult note dictated.   Recommend to proceed with surgery or procedure.   Comments: pt seen previously 10/12, for volvulus vs. pseudoobstruction. pt needs sigmoidoscopy for decompression. awaiting available time in endo suite. will follow.  Electronic Signatures: Lattie Hawooper, Astin Sayre E (MD)  (Signed 13-Aug-13 23:16)  Authored: Brief Consult Note   Last Updated: 13-Aug-13 23:16 by Lattie Hawooper, Keyshawna Prouse E (MD)
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
## 2014-07-13 NOTE — Op Note (Signed)
PATIENT NAME:  [name], [name] MR#:  [id number] DATE OF BIRTH:  09-Sep-1970  DATE OF PROCEDURE:  11/09/2011  PREOPERATIVE DIAGNOSIS: Recurrent sigmoid volvulus and C6 quadriplegia.   POSTOPERATIVE DIAGNOSIS: Recurrent sigmoid volvulus and C6 quadriplegia.   PROCEDURE PERFORMED:  Sigmoid colectomy with construction of end sigmoid colostomy.   SURGEON: Delno Blaisdell A. Egbert GaribaldiBird, MD FACS  ASSISTANT: Wallace KellerLauren Weinberg, PA student   TYPE OF ANESTHESIA: General oroendotracheal.   INDICATIONS: This is a 44 year old male with a long-standing history of traumatic C6 quadriplegia. He has a long-standing history of decubitus ulcers. He has a chronic indwelling suprapubic catheter. The patient was recently admitted with a sigmoid volvulus which appears to be a recurrent process within the last six months. This was able to be reduced endoscopically, and he now presents for elective repair and construction of end colostomy given his history of decubitus ulcers. I discussed this with him extensively. He understands and wishes to proceed.   FINDINGS: The sigmoid colon was massively distended. The proximal descending sigmoid colon junction was normal in caliber. This was used as an ostomy.   ESTIMATED BLOOD LOSS: 100 mL.   DRAINS: None.   COUNTS: Lap and needle counts correct x2.   DESCRIPTION OF PROCEDURE: With the patient in the supine position, general endotracheal anesthesia was induced. His abdomen was widely and draped with Betadine solution. Time out was observed. An incision centered around the umbilicus was fashioned with a scalpel and carried down through musculofascial layers with electrocautery. The peritoneum was entered sharply. The massively distended loop of sigmoid colon was readily eviscerated from the abdomen. The proximal and distal aspects were identified. The proximal colon was divided with a single fire of the GIA-75 stapler. Distally because of the size of the colon, a TA-90 stapler was used to  transect the distal colon. A second fire was used to close the proximal end to avoid spillage. The intervening mesentery was then sequentially scored, clamped, cut and tied utilizing #0 Vicryl suture and 0 Vicryl suture ligatures on larger vessels. The specimen was handed off the field as specimen.   An appropriate site for the colostomy was fashioned with a circular incision in the skin and cruciate incisions through both the anterior and posterior fascia with the rectus muscle being then split bluntly. The end of the descending colon was brought up through this without any undue tension. It was secured to the peritoneal layer with several silk sutures of 3-0. It was secured at three points to the anterior fascia with 3-0 silk suture. With lap and needle count correct x2, the abdominal midline fascia was closed with running #1 Vicryl. Subcutaneous tissues were irrigated. Hemostasis was obtained in the subcutaneous tissues, and skin edges were approximated utilizing a skin stapler. A Brooke colostomy was then formed with 3-0 chromic suture. An ostomy appliance and sterile dressing was applied, and the patient was subsequently extubated and taken to the recovery room in stable and satisfactory condition by Anesthesia Services.   ____________________________ Kevin GainerMark A. Egbert GaribaldiBird, MD mab:cbb D: 11/10/2011 15:38:34 ET T: 11/10/2011 15:44:53 ET JOB#: [id number]  cc: Loraine LericheMark A. Egbert GaribaldiBird, MD, <Dictator> Raynald KempMARK A Asyia Hornung MD ELECTRONICALLY SIGNED 11/14/2011 15:05

## 2014-07-13 NOTE — Consult Note (Signed)
Brief Consult Note: Diagnosis: c6 quad with recurrent sigmoid volvulus and intermittent pseudo-obstruction.   Patient was seen by consultant.   Consult note dictated.   Comments: needs sigmoid colectomy and colostomy for both quad care purposes and for volvulus.  tentative plan friday.  His main concern is losing his place at long term SNF.  Will need CM to address this issue prior to proceeding.  If unable will need colectomy/colostomy at a latter date.  Electronic Signatures: Natale LayBird, Mouhamad Teed (MD)  (Signed 14-Aug-13 17:23)  Authored: Brief Consult Note   Last Updated: 14-Aug-13 17:23 by Natale LayBird, Torrin Frein (MD)
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
## 2014-07-13 NOTE — H&P (Signed)
PATIENT NAME:  [name], Harvin MR#:  [id number] DATE OF BIRTH:  June 29, 1970  DATE OF ADMISSION:  11/06/2011  ADDENDUM  Please add urinary tract infection to the diagnosis and add Rocephin IVPB for urinary tract infection to plan of treatment.   ____________________________ Shaune PollackQing Jenne Sellinger, MD qc:cms D: 11/06/2011 21:13:07 ET T: 11/07/2011 07:14:13 ET JOB#: [id number]  cc: Shaune PollackQing Elyan Vanwieren, MD, <Dictator>  Shaune PollackQING Irmgard Rampersaud MD ELECTRONICALLY SIGNED 11/08/2011 14:35
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
## 2014-07-16 NOTE — Consult Note (Signed)
CC: unable to take in nourishment.  Will try for feeding tube . tomorrow.  Consent from grandmother.  Electronic Signatures: Scot JunElliott, Robert T (MD)  (Signed on 01-Oct-14 17:03)  Authored  Last Updated: 01-Oct-14 17:03 by Scot JunElliott, Robert T (MD)
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
## 2014-07-16 NOTE — Consult Note (Signed)
Pt with temp elevation, will cancel PEG for today and check tomorrow.  Will hold feeding after midnight in case can do it tomorrow.  Electronic Signatures: Scot JunElliott, Jelisa [place] T (MD)  (Signed on 02-Oct-14 21:06)  Authored  Last Updated: 02-Oct-14 21:06 by Scot JunElliott, River Ambrosio T (MD)

## 2014-07-16 NOTE — Op Note (Signed)
PATIENT NAME:  [name], Freemon MR#:  [id number] DATE OF BIRTH:  08-25-1970  DATE OF PROCEDURE:  12/22/2012  PREOPERATIVE DIAGNOSIS: Respiratory failure.   POSTOPERATIVE DIAGNOSIS: Respiratory failure.  PROCEDURE PERFORMED: Tracheostomy tube.   SURGEON: Bud Facereighton Damoni Causby, M.D.   ANESTHESIA: General endotracheal anesthesia.   ESTIMATED BLOOD LOSS: Less than 10 mL.   IV FLUIDS: Please see anesthesia record.   COMPLICATIONS: None.   DRAINS/STENT PLACEMENTS: Size 8 Shiley cuffed tracheostomy tube.   INDICATIONS FOR PROCEDURE: The patient is a 44 year old male with history of respiratory failure and need for long-term ventilation. The patient has prior history of tracheostomy tube in the past that has been decannulated.   OPERATIVE FINDINGS: Size 8 Shiley cuffed tracheostomy tube successfully placed. There was a mild amount of anterior tracheal scarring from previous trach placement.   DESCRIPTION OF PROCEDURE: The patient was identified in the CCU. The patient was taken to the operating room and placed in the supine position. General endotracheal anesthesia was continued. A shoulder roll was placed and 5 mL of 1% lidocaine 1:100,000 epinephrine was injected into the patient's anterior neck crease at his previous tracheostomy tube site. The patient was prepped and draped in sterile fashion. A 15 blade scalpel was used to make a horizontal skin incision along the patient's anterior neck. Scar tissue was encountered and Bovie electrocautery was used to divide the scar tissue. The strap muscles were encountered and median raphe was lysed using Bovie electrocautery and the anterior tracheal wall was encountered. This demonstrated previously a divided thyroid isthmus. Bovie electrocautery was used to clean the anterior tracheal wall. At this time, an 11 blade scalpel was used to make a horizontal tracheal incision near the inferior aspect of his previous tracheostomy site and heavy curved Mayo scissors  were used to enlarge the tracheostomy site. This demonstrated a large, widely patent trachea and the endotracheal tube was in place. This was removed superiorly until the distal tip was above the tracheostomy site and a size 8 Shiley cuffed tracheostomy tube was placed through the trachea stoma site and good ventilation was achieved with good end-tidal volumes as well as CO2 return. At this time, Surgicel was placed within the thyroid wound bed. The tracheostomy tube was secured in a four-point fashion using 2-0 silk suture and a Dale collar and Betadine soaked gauze was placed within the nerve beneath the tracheostomy tube. At this time, care of the patient was transferred to anesthesia, where the patient is transported to CCU in guarded condition.     ____________________________ Kyung Ruddreighton C. Zyden Suman, MD ccv:aw D: 12/22/2012 13:20:36 ET T: 12/22/2012 14:33:27 ET JOB#: [id number]  cc: Kyung Ruddreighton C. Zarion Oliff, MD, <Dictator> Kyung RuddREIGHTON C Indy Kuck MD ELECTRONICALLY SIGNED 01/07/2013 6:46

## 2014-07-16 NOTE — Consult Note (Signed)
Brief Consult Note: Diagnosis: Neurogenic bladder. Bladder spasms. Quadriplegia.   Patient was seen by consultant.   Consult note dictated.   Recommend further assessment or treatment.   Orders entered.   Discussed with Attending MD.   Comments: Oxytrol patches[facility] Follow-up with his regular urologist.  Electronic Signatures: Orson ApeWolff, Michael R (MD)  (Signed 01-Oct-14 12:19)  Authored: Brief Consult Note   Last Updated: 01-Oct-14 12:19 by Orson ApeWolff, Michael R (MD)

## 2014-07-16 NOTE — Consult Note (Signed)
PEG performed today.   External bumper placed at 4.5 cm.   OK to start water and meds through tube in 4 hours, and tube feeds starting tomorrow.    Electronic Signatures: Dow Adolphein, Yesenia Fontenette (MD)  (Signed on 03-Oct-14 16:09)  Authored  Last Updated: 03-Oct-14 16:09 by Dow Adolphein, Dorris Pierre (MD)
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
## 2014-07-16 NOTE — Consult Note (Signed)
Chief Complaint:  Subjective/Chief Complaint Called to see patient regarding leaking around his SPT. Please refer to the Original Urology Consultation Report by Dr. Maryan Puls dated 12/24/2012, for the same reason.   VITAL SIGNS/ANCILLARY NOTES: **Vital Signs.:   11-Oct-14 11:00  Vital Signs Type Routine  Temperature Source unable to obtain temp. MD notifed  Pulse Pulse 84  Pulse source if not from Vital Sign Device per cardiac monitor  Respirations Respirations 20  Systolic BP Systolic BP 93  Diastolic BP (mmHg) Diastolic BP (mmHg) 65  Mean BP 74  Pulse Ox % Pulse Ox % 48  Pulse Ox Activity Level  At rest  Oxygen Delivery Ventilator Assisted  Pulse Ox Heart Rate 92  *Intake and Output.:   Daily 11-Oct-14 07:00  Grand Totals Intake:  1217.5 Output:  1975    Net:  -757.5 24 Hr.:  -757.5  Enteral feeding ml     In:  436  Enteral Feeding flush intake      In:  250  IV (Primary)      In:  110  IV (Primary)      In:  21.5  IV (Secondary)      In:  300  Other Intake cc     In:  100  Urine ml     Out:  1275  Colostomy ml     Out:  700  Length of Stay Totals Intake:  91318.45 Output:  58610    Net:  32708.45    Shift 15:00  Grand Totals Intake:  580 Output:  250    Net:  330 24 Hr.:  330  IV (Primary)      In:  20  IV (Secondary)      In:  500  Other Intake cc     In:  60  Emesis ml     Out:  200  Other Output ml     Out:  50  Length of Stay Totals Intake:  23557.32 Output:  58860    Net:  33038.45   Brief Assessment:  GEN well developed, no acute distress, obese   Additional Physical Exam SPT site - clean and dry (towel wrapped around the tube at the insertion site) -SPT is draining well - observed for several minutes, not leaking from the SPT appreciated   Lab Results: Thyroid:  11-Oct-14 05:25   Thyroid Stimulating Hormone 1.01 (0.45-4.50 (International Unit)  ----------------------- Pregnant patients have  different reference  ranges for TSH:  - -  - - - - - - - -  Pregnant, first trimetser:  0.36 - 2.50 uIU/mL)  Routine Chem:  11-Oct-14 05:25   Glucose, Serum 97  BUN  33  Creatinine (comp) 0.96  Sodium, Serum  149  Potassium, Serum  3.0  Chloride, Serum  114  CO2, Serum 26  Calcium (Total), Serum 9.7  Anion Gap 9  Osmolality (calc) 303  eGFR (African American) >60  eGFR (Non-African American) >60 (eGFR values <19m/min/1.73 m2 may be an indication of chronic kidney disease (CKD). Calculated eGFR is useful in patients with stable renal function. The eGFR calculation will not be reliable in acutely ill patients when serum creatinine is changing rapidly. It is not useful in  patients on dialysis. The eGFR calculation may not be applicable to patients at the low and high extremes of body sizes, pregnant women, and vegetarians.)  Phosphorus, Serum 4.2 (Result(s) reported on 03 Jan 2013 at 05:50AM.)  Magnesium, Serum 1.9 (1.8-2.4 THERAPEUTIC RANGE: 4-7 mg/dL  TOXIC: > 10 mg/dL  -----------------------)   Assessment/Plan:  Assessment/Plan:  Assessment Intermittent leaking from around the SPT -currently, the SPT is draining well without any leaking, however, pt may be experiencing periodic bladder spasms -the patient had been on oxybutynin prior to admission, however, has been experiencing paroxysmal N/V so use of his PEG has been restricted. -a trial of transdermal oxybutynin, as recommended by Dr. Yves Dill, has apparently not resulted in any improvement -this is a difficult circumstance wit neither a straight-forward solution, nor immediate solution.   Plan 1. Continue transdermal oxybutynin (one patch every Mon and Thurs) 2. Consider oxybutynin 5-10mg via the G-Tube q6 hours, when able to use the G-tube 3. Continue monthly SPT changes (?last changed 12/24/2012 by nursing, per Dr. Letta Kocher note)   Electronic Signatures: Darcella Cheshire (MD)  (Signed 11-Oct-14 12:03)  Authored: Chief Complaint, VITAL SIGNS/ANCILLARY NOTES, Brief  Assessment, Lab Results, Assessment/Plan   Last Updated: 11-Oct-14 12:03 by Darcella Cheshire (MD)
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
## 2014-07-16 NOTE — Consult Note (Signed)
Brief Consult Note: Diagnosis: ileus secondary to Sepsis, pancreatitis, quadraplegi, UTI and sepsis.   Patient was seen by consultant.   Consult note dictated.   Comments: I see no indication for surgical intervention, will follow with you.  Electronic Signatures: Natale LayBird, Seniyah Esker (MD)  (Signed 13-Sep-14 13:56)  Authored: Brief Consult Note   Last Updated: 13-Sep-14 13:56 by Natale LayBird, Maylon Sailors (MD)
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
## 2014-07-16 NOTE — Consult Note (Signed)
Details:   - Discussed patient with pt's mom.  She wishes to hold on tracheostomy tube placement at this time and re-schedule to Monday if possible.   Electronic Signatures: Lucinda Spells, Rayfield Citizenreighton Charles (MD)  (Signed 24-Sep-14 17:11)  Authored: Details   Last Updated: 24-Sep-14 17:11 by Flossie DibbleVaught, Maylynn Orzechowski Charles (MD)

## 2014-07-16 NOTE — Discharge Summary (Signed)
PATIENT NAME:  [name], [name] MR#:  [id number] DATE OF BIRTH:  1970-04-17  DATE OF ADMISSION:  12/05/2012 DATE OF INTERIM DISCHARGE SUMMARY:  Covers from September 25 up until October 2. Please refer to interim summary done by Dr. Renae Gloss on September 24.    ADMITTING DIAGNOSIS:  At the time of interim summary:  1.  Septic shock with urinary tract infection as well as pneumonia with persistent intermittent fevers.  2.  Acute respiratory failure status post tracheostomy.  3.  Acute renal failure secondary to acute tubular necrosis, now resolved.  4.  Hyperkalemia due to acute renal failure, now resolved.  5.  Other electrolyte imbalances being currently replacement.  6.  Metabolic acidosis, now resolved.  7.  Acute pancreatitis.  8.  Chronic constipation and Ogilvie syndrome. Currently, his abdomen is not distended, is having good bowel movements.  9.  Thrombocytopenia, likely related to sepsis.  10.  Decubitus ulcer.   CONSULTANTS DURING THIS TIMEFRAME THAT FOLLOWED THE PATIENT:  Dr. Sampson Goon, Dr. Thedore Mins, Dr. Belia Heman, Dr. Freda Munro, Dr. Andee Poles. Also Dr. Mechele Collin.  PROCEDURES:  On September 29, tracheostomy tube placement.   PERTINENT LABS AND EVALUATIONS DURING THIS TIME FRAME:  His most recent BMP on October 1 was glucose 224, BUN 36, creatinine 1.39, sodium 143, potassium 3.4, chloride 113, CO2 is 21, triglycerides 453. WBC 10.0, hemoglobin 10, platelet count 364. Urinalysis done on 09/29: 3+ blood, leukocytes 3+, RBCs 923, WBCs 849. Chest x-ray, single Myoview, showed there has not been significant interval change in the appearance of the chest. A tracheostomy is in place.  IN ORDER OF HIS MAJOR PROBLEMS: 1.  Acute respiratory failure. The patient has been unable to be weaned off ventilator. Therefore, he underwent a tracheostomy after his mother was consented to the procedure, without any complications. He is continued maintained on assist-control ventilation. He will need LTAC and further  treatment for weaning off the ventilator.  2.  Septic shock. The patient has been off antibiotics; however, he has been having intermittent fevers and has followed by Dr. Sampson Goon during this time frame. He was noted to have abnormal urinalysis, but Dr. Sampson Goon did not want to treat in light of his hemodynamics status being stable. His suprapubic catheter has been changed; however, is continuing to bleed therefore, a Urology consult has been placed.  3.  Acute renal failure, which has resolved. His acute renal failure due to acute tubular necrosis has improved.  4.  Intermittent episode of bradycardia, possibly related to medications. His heart rate is currently being monitored. He was started on scopolamine recently, that has been discontinued.  5.  Chronic constipation, Ogilvie syndrome, status post colostomy for sigmoid volvulus in the past, has not had any further issues. For nutrition, the patient will need a PEG tube placement. Initially his mother consented to the tracheostomy but then she refused to have anything to do with the patient. Therefore, his grandmother, who also is his health care power of attorney, has consented to the procedure. And Gastroenterology has been.   CURRENT MEDICATIONS:  At this time frame, is fentanyl IV for sedation, insulin drip, Precedex drip, Tylenol 650 q.4 p.r.n., Zofran 4 mg IV q.4, chlorhexidine 5 mL q.12, multivitamins daily, diazepam 10 mg q.8, famotidine 20 q.12,  1 gram IV q.8, lorazepam 2 mg IV q.1 p.r.n. agitation.   NOTE:  35 minutes spent.   ____________________________ Lacie Scotts. Allena Katz, MD shp:jm D: 12/25/2012 15:01:43 ET T: 12/25/2012 15:53:38 ET JOB#: [id number]  cc: Taeshaun Rames H.  Allena KatzPatel, MD, <Dictator> Charise CarwinSHREYANG H Francetta Ilg MD ELECTRONICALLY SIGNED 01/09/2013 18:28

## 2014-07-16 NOTE — Consult Note (Signed)
Brief Consult Note: Diagnosis: Respiratory failure with failure to wean from vent.   Patient was seen by consultant.   Consult note dictated.   Recommend to proceed with surgery or procedure.   Comments: Male with history of quadraplegia and respiratory failure with failure to wean from vent.  Pt. has had tracheostomy tube placed before.  Put on schedule for Thursday at 12.  Please hold tube feeds prior to procedure as well as anticoagulation..  Electronic Signatures: Meshach Perry, Rayfield Citizenreighton Charles (MD)  (Signed 22-Sep-14 17:15)  Authored: Brief Consult Note   Last Updated: 22-Sep-14 17:15 by Flossie DibbleVaught, Rihanna Marseille Charles (MD)

## 2014-07-16 NOTE — Consult Note (Signed)
I have been consulted for a PEG placement which seems like a reasonable thing to do but with the patients mother not wanting to have anything to do with the patient I am not sure who has the authority to make decisions for the patient.  If you (doctors)  could let me know to whom I should communicate about this matter it would be helpful. Anticipate possible Thursday or Friday.  Electronic Signatures: Scot JunElliott, Robert T (MD)  (Signed on 30-Sep-14 17:22)  Authored  Last Updated: 30-Sep-14 17:22 by Scot JunElliott, Robert T (MD)

## 2014-07-16 NOTE — Consult Note (Signed)
   Comments   Spoke with pt's mother. Tried updating her on pt's status. She sounded frustrated saying "I already talked to a Doctor there and was led to believe my son would die". She then told me that she would not be making decisions about stopping care on her son although this was not something that I had mentioned. She handed the phone to her husband and I had a lengthy conversation with him. He says that pt's mother would be the decision maker although he admits her health is not good and that she is under a great deal of stress. He asks that in the future that he Zollie Beckers(Walter) be called first with any decions, updates, or bad news so that he can prepare his wife. He says that [place].   that family would ultimately like [facility] if [facility] and he is weaned from vent. Will ask CM to assist.  Electronic Signatures: Bridgid Printz, Daryl EasternJoshua R (NP)  (Signed 19-Sep-14 14:35)  Authored: Palliative Care Phifer, Harriett SineNancy (MD)  (Signed 19-Sep-14 16:41)  Authored: Palliative Care   Last Updated: 19-Sep-14 16:41 by Phifer, Harriett SineNancy (MD)
